# Patient Record
Sex: Female | Born: 1975 | Race: Black or African American | Hispanic: No | Marital: Single | Smoking: Never smoker
Health system: Southern US, Community
[De-identification: ages and names within clinical notes are randomized; demographics above are authoritative.]

## PROBLEM LIST (undated history)

## (undated) DIAGNOSIS — D259 Leiomyoma of uterus, unspecified: Secondary | ICD-10-CM

## (undated) DIAGNOSIS — D649 Anemia, unspecified: Secondary | ICD-10-CM

## (undated) DIAGNOSIS — M25569 Pain in unspecified knee: Secondary | ICD-10-CM

## (undated) DIAGNOSIS — T7840XA Allergy, unspecified, initial encounter: Secondary | ICD-10-CM

## (undated) DIAGNOSIS — R5383 Other fatigue: Secondary | ICD-10-CM

## (undated) DIAGNOSIS — I10 Essential (primary) hypertension: Secondary | ICD-10-CM

## (undated) DIAGNOSIS — E282 Polycystic ovarian syndrome: Secondary | ICD-10-CM

## (undated) DIAGNOSIS — M549 Dorsalgia, unspecified: Secondary | ICD-10-CM

## (undated) DIAGNOSIS — E559 Vitamin D deficiency, unspecified: Secondary | ICD-10-CM

## (undated) DIAGNOSIS — J45909 Unspecified asthma, uncomplicated: Secondary | ICD-10-CM

## (undated) DIAGNOSIS — R7303 Prediabetes: Secondary | ICD-10-CM

## (undated) DIAGNOSIS — R0602 Shortness of breath: Secondary | ICD-10-CM

## (undated) HISTORY — DX: Polycystic ovarian syndrome: E28.2

## (undated) HISTORY — DX: Essential (primary) hypertension: I10

## (undated) HISTORY — DX: Vitamin D deficiency, unspecified: E55.9

## (undated) HISTORY — DX: Unspecified asthma, uncomplicated: J45.909

## (undated) HISTORY — DX: Anemia, unspecified: D64.9

## (undated) HISTORY — DX: Other fatigue: R53.83

## (undated) HISTORY — DX: Dorsalgia, unspecified: M54.9

## (undated) HISTORY — DX: Shortness of breath: R06.02

## (undated) HISTORY — DX: Prediabetes: R73.03

## (undated) HISTORY — DX: Pain in unspecified knee: M25.569

## (undated) HISTORY — DX: Leiomyoma of uterus, unspecified: D25.9

## (undated) HISTORY — DX: Allergy, unspecified, initial encounter: T78.40XA

---
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
## 1999-05-16 ENCOUNTER — Other Ambulatory Visit: Admission: RE | Admit: 1999-05-16 | Discharge: 1999-05-16 | Payer: Self-pay | Admitting: Obstetrics and Gynecology

## 2000-01-04 ENCOUNTER — Encounter: Payer: Self-pay | Admitting: Obstetrics and Gynecology

## 2000-01-04 ENCOUNTER — Ambulatory Visit (HOSPITAL_COMMUNITY): Admission: RE | Admit: 2000-01-04 | Discharge: 2000-01-04 | Payer: Self-pay | Admitting: Obstetrics and Gynecology

## 2001-09-22 ENCOUNTER — Other Ambulatory Visit: Admission: RE | Admit: 2001-09-22 | Discharge: 2001-09-22 | Payer: Self-pay | Admitting: Internal Medicine

## 2003-02-02 ENCOUNTER — Other Ambulatory Visit: Admission: RE | Admit: 2003-02-02 | Discharge: 2003-02-02 | Payer: Self-pay | Admitting: Obstetrics and Gynecology

## 2004-03-15 ENCOUNTER — Ambulatory Visit (HOSPITAL_COMMUNITY): Admission: RE | Admit: 2004-03-15 | Discharge: 2004-03-15 | Payer: Self-pay | Admitting: Obstetrics and Gynecology

## 2004-04-25 ENCOUNTER — Emergency Department (HOSPITAL_COMMUNITY): Admission: EM | Admit: 2004-04-25 | Discharge: 2004-04-25 | Payer: Self-pay | Admitting: Family Medicine

## 2005-11-06 ENCOUNTER — Emergency Department (HOSPITAL_COMMUNITY): Admission: EM | Admit: 2005-11-06 | Discharge: 2005-11-06 | Payer: Self-pay | Admitting: Emergency Medicine

## 2008-05-20 ENCOUNTER — Emergency Department (HOSPITAL_COMMUNITY): Admission: EM | Admit: 2008-05-20 | Discharge: 2008-05-20 | Payer: Self-pay | Admitting: Emergency Medicine

## 2008-08-13 ENCOUNTER — Encounter: Admission: RE | Admit: 2008-08-13 | Discharge: 2008-08-13 | Payer: Self-pay | Admitting: Obstetrics and Gynecology

## 2008-08-17 ENCOUNTER — Encounter: Admission: RE | Admit: 2008-08-17 | Discharge: 2008-08-17 | Payer: Self-pay | Admitting: Obstetrics and Gynecology

## 2011-01-19 ENCOUNTER — Encounter: Payer: Self-pay | Admitting: Obstetrics and Gynecology

## 2011-01-20 ENCOUNTER — Encounter: Payer: Self-pay | Admitting: Obstetrics and Gynecology

## 2011-12-13 ENCOUNTER — Other Ambulatory Visit: Payer: Self-pay | Admitting: Family Medicine

## 2011-12-13 DIAGNOSIS — Z1231 Encounter for screening mammogram for malignant neoplasm of breast: Secondary | ICD-10-CM

## 2012-01-02 ENCOUNTER — Ambulatory Visit
Admission: RE | Admit: 2012-01-02 | Discharge: 2012-01-02 | Disposition: A | Discharge status: Home / Self Care | Payer: BC Managed Care – PPO | Source: Ambulatory Visit | Attending: Family Medicine | Admitting: Family Medicine

## 2012-01-02 DIAGNOSIS — Z1231 Encounter for screening mammogram for malignant neoplasm of breast: Secondary | ICD-10-CM

## 2012-06-04 ENCOUNTER — Other Ambulatory Visit: Payer: Self-pay | Admitting: Endocrinology

## 2012-06-04 DIAGNOSIS — E221 Hyperprolactinemia: Secondary | ICD-10-CM

## 2012-06-17 ENCOUNTER — Other Ambulatory Visit: Payer: BC Managed Care – PPO

## 2012-06-18 ENCOUNTER — Other Ambulatory Visit: Payer: BC Managed Care – PPO

## 2012-06-22 ENCOUNTER — Ambulatory Visit
Admission: RE | Admit: 2012-06-22 | Discharge: 2012-06-22 | Disposition: A | Discharge status: Home / Self Care | Payer: BC Managed Care – PPO | Source: Ambulatory Visit | Attending: Endocrinology | Admitting: Endocrinology

## 2012-06-22 DIAGNOSIS — E221 Hyperprolactinemia: Secondary | ICD-10-CM

## 2012-06-22 MED ORDER — GADOBENATE DIMEGLUMINE 529 MG/ML IV SOLN
15.0000 mL | Freq: Once | INTRAVENOUS | Status: AC | PRN
  Administered 2012-06-22: 15 mL via INTRAVENOUS

## 2013-04-26 ENCOUNTER — Ambulatory Visit (INDEPENDENT_AMBULATORY_CARE_PROVIDER_SITE_OTHER): Payer: BC Managed Care – PPO | Admitting: Internal Medicine

## 2013-04-26 ENCOUNTER — Encounter: Payer: Self-pay | Admitting: Internal Medicine

## 2013-04-26 ENCOUNTER — Telehealth: Payer: Self-pay | Admitting: *Deleted

## 2013-04-26 VITALS — BP 126/84 | HR 87 | Temp 97.6°F | Resp 20 | Ht 62.0 in | Wt 301.0 lb

## 2013-04-26 DIAGNOSIS — E041 Nontoxic single thyroid nodule: Secondary | ICD-10-CM | POA: Insufficient documentation

## 2013-04-26 DIAGNOSIS — Z862 Personal history of diseases of the blood and blood-forming organs and certain disorders involving the immune mechanism: Secondary | ICD-10-CM

## 2013-04-26 DIAGNOSIS — D259 Leiomyoma of uterus, unspecified: Secondary | ICD-10-CM | POA: Insufficient documentation

## 2013-04-26 DIAGNOSIS — I10 Essential (primary) hypertension: Secondary | ICD-10-CM | POA: Insufficient documentation

## 2013-04-26 DIAGNOSIS — N920 Excessive and frequent menstruation with regular cycle: Secondary | ICD-10-CM

## 2013-04-26 DIAGNOSIS — D352 Benign neoplasm of pituitary gland: Secondary | ICD-10-CM | POA: Insufficient documentation

## 2013-04-26 LAB — CBC WITH DIFFERENTIAL/PLATELET
Basophils Absolute: 0.1 10*3/uL (ref 0.0–0.1)
Basophils Relative: 1 % (ref 0–1)
Eosinophils Absolute: 0.4 10*3/uL (ref 0.0–0.7)
Eosinophils Relative: 6 % — ABNORMAL HIGH (ref 0–5)
HCT: 35.5 % — ABNORMAL LOW (ref 36.0–46.0)
Hemoglobin: 11.4 g/dL — ABNORMAL LOW (ref 12.0–15.0)
Lymphocytes Relative: 31 % (ref 12–46)
Lymphs Abs: 2.4 10*3/uL (ref 0.7–4.0)
MCH: 22.9 pg — ABNORMAL LOW (ref 26.0–34.0)
MCHC: 32.1 g/dL (ref 30.0–36.0)
MCV: 71.3 fL — ABNORMAL LOW (ref 78.0–100.0)
Monocytes Absolute: 0.6 10*3/uL (ref 0.1–1.0)
Monocytes Relative: 8 % (ref 3–12)
Neutro Abs: 4.2 10*3/uL (ref 1.7–7.7)
Neutrophils Relative %: 54 % (ref 43–77)
Platelets: 387 10*3/uL (ref 150–400)
RBC: 4.98 MIL/uL (ref 3.87–5.11)
RDW: 14.7 % (ref 11.5–15.5)
WBC: 7.7 10*3/uL (ref 4.0–10.5)

## 2013-04-26 LAB — COMPREHENSIVE METABOLIC PANEL
ALT: 14 U/L (ref 0–35)
AST: 13 U/L (ref 0–37)
Albumin: 3.5 g/dL (ref 3.5–5.2)
Alkaline Phosphatase: 84 U/L (ref 39–117)
BUN: 6 mg/dL (ref 6–23)
CO2: 27 mEq/L (ref 19–32)
Calcium: 9.2 mg/dL (ref 8.4–10.5)
Chloride: 104 mEq/L (ref 96–112)
Creat: 0.62 mg/dL (ref 0.50–1.10)
Glucose, Bld: 78 mg/dL (ref 70–99)
Potassium: 4 mEq/L (ref 3.5–5.3)
Sodium: 137 mEq/L (ref 135–145)
Total Bilirubin: 0.3 mg/dL (ref 0.3–1.2)
Total Protein: 6.6 g/dL (ref 6.0–8.3)

## 2013-04-26 LAB — LIPID PANEL
Cholesterol: 166 mg/dL (ref 0–200)
LDL Cholesterol: 108 mg/dL — ABNORMAL HIGH (ref 0–99)
Total CHOL/HDL Ratio: 4 Ratio
Triglycerides: 83 mg/dL (ref <150)
VLDL: 17 mg/dL (ref 0–40)

## 2013-04-26 LAB — TSH: TSH: 0.37 u[IU]/mL (ref 0.350–4.500)

## 2013-04-26 NOTE — Patient Instructions (Addendum)
See me in j4-6 weeks

## 2013-04-26 NOTE — Progress Notes (Signed)
  Subjective:    Patient ID: April Mann, female    DOB: [birth year], 37 y.o.   MRN: [id number]  HPI  April Mann is a new pt here to establish care.  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    She is concerned over worsening menorrhagia.  LMP 4/15 and states she bleed for 11 days.  She has a history of fibroids.  She reports she has not been back to Dr. Katrinka Blazing since she was started on Dostinex several months ago.  She has not taken any iron as she says it constipates her.  No Known Allergies Past Medical History  Diagnosis Date  . Hypertension   . Fibroid, uterine   . Anemia    History reviewed. No pertinent past surgical history. History   Social History  . Marital Status: Single    Spouse Name: N/A    Number of Children: N/A  . Years of Education: N/A   Occupational History  . Not on file.   Social History Main Topics  . Smoking status: Never Smoker   . Smokeless tobacco: Not on file  . Alcohol Use: No  . Drug Use: No  . Sexually Active: Yes    Birth Control/ Protection: Condom   Other Topics Concern  . Not on file   Social History Narrative  . No narrative on file   Family History  Problem Relation Age of Onset  . Fibromyalgia Mother   . Lupus Maternal Grandmother   . Kidney disease Maternal Grandfather   . Diabetes Maternal Grandfather    Patient Active Problem List   Diagnosis Date Noted  . Prolactinoma 04/26/2013  . Essential hypertension, benign 04/26/2013  . History of anemia 04/26/2013  . Thyroid nodule 04/26/2013  . Fibroid, uterine    No current outpatient prescriptions on file prior to visit.   No current facility-administered medications on file prior to visit.       Review of Systems See HPI    Objective:   Physical Exam  Physical Exam  Nursing note and vitals reviewed.  Constitutional: She is oriented to person, place, and time. She appears  well-developed and well-nourished.  HENT:  Head: Normocephalic and atraumatic.  Cardiovascular: Normal rate and regular rhythm. Exam reveals no gallop and no friction rub.  No murmur heard.  Pulmonary/Chest: Breath sounds normal. She has no wheezes. She has no rales.  Neurological: She is alert and oriented to person, place, and time.  Skin: Skin is warm and dry.  Psychiatric: She has a normal mood and affect. Her behavior is normal.  Ext no edema        Assessment & Plan:  Menorrhagia/ HIstory of fibroids  Will refer for second opinion regarding treatment options  to Dr. Marice Potter  History of anemia  Check today I gave pt Integra samples to be taken once a day  HTN check chemistries  TSH  Continue current meds  Prolactinoma  Counseled pt of the importance of following with her endcrinologist Dr. Katrinka Blazing.  She has only seen him once.  She voices understanding  Morbid obesity.

## 2013-04-26 NOTE — Telephone Encounter (Signed)
Made appointment with Dr Dove at [facility] for May 1 at 1:45. Pt notified while at office. 

## 2013-04-28 ENCOUNTER — Other Ambulatory Visit: Payer: Self-pay | Admitting: Internal Medicine

## 2013-04-28 MED ORDER — INTEGRA F 125-1 MG PO CAPS: 1.0000 | ORAL_CAPSULE | Freq: Every day | ORAL | Status: DC

## 2013-04-28 NOTE — Progress Notes (Signed)
Pharmacy information

## 2013-04-29 ENCOUNTER — Other Ambulatory Visit: Payer: Self-pay | Admitting: *Deleted

## 2013-04-29 ENCOUNTER — Encounter: Payer: Self-pay | Admitting: *Deleted

## 2013-04-29 ENCOUNTER — Telehealth: Payer: Self-pay | Admitting: *Deleted

## 2013-04-29 ENCOUNTER — Encounter: Payer: PRIVATE HEALTH INSURANCE | Admitting: Obstetrics & Gynecology

## 2013-04-29 MED ORDER — INTEGRA F 125-1 MG PO CAPS: 1.0000 | ORAL_CAPSULE | Freq: Every day | ORAL | Status: DC

## 2013-04-29 NOTE — Telephone Encounter (Signed)
LVM message regarding her results and medication also mailed results to her home address

## 2013-04-29 NOTE — Telephone Encounter (Signed)
Message copied by Mathews Robinsons on Thu Apr 29, 2013 10:19 AM ------      Message from: Raechel Chute D      Created: Wed Apr 28, 2013 11:47 AM       [name]             Call pt and let her know that she is mildly anemic and be sure to keep appt with Dr. Marice Potter and to take her Integra samples I gave her.  I will also order Ingtegra to her pharmacy for her to pick up. Take one a day ------

## 2013-05-18 ENCOUNTER — Encounter: Payer: PRIVATE HEALTH INSURANCE | Admitting: Obstetrics & Gynecology

## 2013-05-20 ENCOUNTER — Telehealth: Payer: Self-pay | Admitting: Internal Medicine

## 2013-05-20 NOTE — Telephone Encounter (Signed)
April Mann   I note pt did not keep appt with Dr. Marice Potter that was scheduled for 04/29/2013 .   Call pt and advise her to reschedule this appt .  At her earliest convenience and counsel it is important to do so as she is anemic

## 2013-05-20 NOTE — Telephone Encounter (Signed)
LVM message with pt to return call advised pt to reschedule and left number for Dr Ellin Saba office

## 2013-06-28 ENCOUNTER — Telehealth: Payer: Self-pay | Admitting: *Deleted

## 2013-06-28 NOTE — Telephone Encounter (Signed)
Needs refill of Lisinopril.  Has used last pill. Uses [facility] [address]; their phone # is [phone]

## 2013-06-29 ENCOUNTER — Other Ambulatory Visit: Payer: Self-pay | Admitting: *Deleted

## 2013-06-29 MED ORDER — LISINOPRIL-HYDROCHLOROTHIAZIDE 10-12.5 MG PO TABS: 1.0000 | ORAL_TABLET | Freq: Every day | ORAL | Status: DC

## 2013-06-29 NOTE — Telephone Encounter (Signed)
Refill request

## 2013-09-07 ENCOUNTER — Other Ambulatory Visit: Payer: Self-pay | Admitting: *Deleted

## 2013-09-07 MED ORDER — LISINOPRIL-HYDROCHLOROTHIAZIDE 10-12.5 MG PO TABS: 1.0000 | ORAL_TABLET | Freq: Every day | ORAL | Status: DC

## 2014-01-05 ENCOUNTER — Other Ambulatory Visit: Payer: Self-pay | Admitting: *Deleted

## 2014-01-05 ENCOUNTER — Telehealth: Payer: Self-pay | Admitting: *Deleted

## 2014-01-05 MED ORDER — LISINOPRIL-HYDROCHLOROTHIAZIDE 10-12.5 MG PO TABS: 1.0000 | ORAL_TABLET | Freq: Every day | ORAL | Status: DC

## 2014-01-05 NOTE — Telephone Encounter (Signed)
April Mann  Call pt and give her a 30 min appt.  I have not seen her since last April    Will refill for 2 months

## 2014-01-05 NOTE — Telephone Encounter (Signed)
Orlene needs her  lisinopril-hydrochlorothiazide (PRINZIDE,ZESTORETIC) 10-12.5 MG per tablet [[id number]] refilled  She only has 5-6 left.  She would like a 90 day supply. [facility].

## 2014-01-06 NOTE — Telephone Encounter (Signed)
Pt has an appt in March

## 2014-03-24 ENCOUNTER — Encounter: Payer: Self-pay | Admitting: Internal Medicine

## 2014-03-24 ENCOUNTER — Ambulatory Visit (INDEPENDENT_AMBULATORY_CARE_PROVIDER_SITE_OTHER): Payer: PRIVATE HEALTH INSURANCE | Admitting: Internal Medicine

## 2014-03-24 ENCOUNTER — Other Ambulatory Visit: Payer: Self-pay | Admitting: Internal Medicine

## 2014-03-24 VITALS — BP 139/92 | HR 76 | Temp 98.5°F | Resp 18 | Wt 299.0 lb

## 2014-03-24 DIAGNOSIS — D353 Benign neoplasm of craniopharyngeal duct: Secondary | ICD-10-CM

## 2014-03-24 DIAGNOSIS — D352 Benign neoplasm of pituitary gland: Secondary | ICD-10-CM

## 2014-03-24 DIAGNOSIS — I10 Essential (primary) hypertension: Secondary | ICD-10-CM

## 2014-03-24 DIAGNOSIS — E041 Nontoxic single thyroid nodule: Secondary | ICD-10-CM

## 2014-03-24 DIAGNOSIS — Z862 Personal history of diseases of the blood and blood-forming organs and certain disorders involving the immune mechanism: Secondary | ICD-10-CM

## 2014-03-24 DIAGNOSIS — Z139 Encounter for screening, unspecified: Secondary | ICD-10-CM

## 2014-03-24 DIAGNOSIS — Z0000 Encounter for general adult medical examination without abnormal findings: Secondary | ICD-10-CM

## 2014-03-24 LAB — LIPID PANEL
CHOL/HDL RATIO: 4.4 ratio
Cholesterol: 168 mg/dL (ref 0–200)
HDL: 38 mg/dL — AB (ref >39)
LDL CALC: 118 mg/dL — AB (ref 0–99)
Triglycerides: 59 mg/dL (ref <150)
VLDL: 12 mg/dL (ref 0–40)

## 2014-03-24 LAB — CBC WITH DIFFERENTIAL/PLATELET
BASOS ABS: 0.1 10*3/uL (ref 0.0–0.1)
BASOS PCT: 1 % (ref 0–1)
EOS PCT: 3 % (ref 0–5)
Eosinophils Absolute: 0.3 10*3/uL (ref 0.0–0.7)
HCT: 36.8 % (ref 36.0–46.0)
Hemoglobin: 12.2 g/dL (ref 12.0–15.0)
LYMPHS PCT: 33 % (ref 12–46)
Lymphs Abs: 2.8 10*3/uL (ref 0.7–4.0)
MCH: 22.6 pg — ABNORMAL LOW (ref 26.0–34.0)
MCHC: 33.2 g/dL (ref 30.0–36.0)
MCV: 68.1 fL — ABNORMAL LOW (ref 78.0–100.0)
Monocytes Absolute: 0.7 10*3/uL (ref 0.1–1.0)
Monocytes Relative: 8 % (ref 3–12)
NEUTROS ABS: 4.7 10*3/uL (ref 1.7–7.7)
Neutrophils Relative %: 55 % (ref 43–77)
PLATELETS: 407 10*3/uL — AB (ref 150–400)
RBC: 5.4 MIL/uL — ABNORMAL HIGH (ref 3.87–5.11)
RDW: 15.1 % (ref 11.5–15.5)
WBC: 8.5 10*3/uL (ref 4.0–10.5)

## 2014-03-24 LAB — COMPREHENSIVE METABOLIC PANEL
ALK PHOS: 95 U/L (ref 39–117)
ALT: 18 U/L (ref 0–35)
AST: 15 U/L (ref 0–37)
Albumin: 3.7 g/dL (ref 3.5–5.2)
BUN: 8 mg/dL (ref 6–23)
CALCIUM: 9.5 mg/dL (ref 8.4–10.5)
CHLORIDE: 104 meq/L (ref 96–112)
CO2: 25 mEq/L (ref 19–32)
CREATININE: 0.6 mg/dL (ref 0.50–1.10)
Glucose, Bld: 82 mg/dL (ref 70–99)
POTASSIUM: 4.3 meq/L (ref 3.5–5.3)
Sodium: 138 mEq/L (ref 135–145)
Total Bilirubin: 0.3 mg/dL (ref 0.2–1.2)
Total Protein: 6.7 g/dL (ref 6.0–8.3)

## 2014-03-24 LAB — TSH: TSH: 0.277 u[IU]/mL — AB (ref 0.350–4.500)

## 2014-03-25 LAB — VITAMIN D 25 HYDROXY (VIT D DEFICIENCY, FRACTURES): VIT D 25 HYDROXY: 46 ng/mL (ref 30–89)

## 2014-03-26 NOTE — Patient Instructions (Signed)
See me in 6 months or prn

## 2014-03-26 NOTE — Progress Notes (Addendum)
Subjective:    Patient ID: April Mann, female    DOB: 09-03-1976, 38 y.o.   MRN: [id number]  HPI  April Mann is here for CPE  Anemia  She has been taking her Integra without constipation - tolerating well.  Anemia felt due to heavy menses.  She has been followed by GYN at [facility] and controlling menses with Sprintec.  She tells me however that this caused swelling and she stopped Sprintec about one week ago.   She has follow up with GYN later this year and will get her pap at that time  HTN:  Well controlled but she is asking if she can come off med  Prolactionoma  Managed by endocrinologist Dr. Tamala Mann  Pt also reports that Dr. Tamala Mann told her she had a small thyroid nodule and that he is following this  No Known Allergies Past Medical History  Diagnosis Date  . Hypertension   . Fibroid, uterine   . Anemia    History reviewed. No pertinent past surgical history. History   Social History  . Marital Status: Single    Spouse Name: N/A    Number of Children: N/A  . Years of Education: N/A   Occupational History  . Not on file.   Social History Main Topics  . Smoking status: Never Smoker   . Smokeless tobacco: Not on file  . Alcohol Use: No  . Drug Use: No  . Sexual Activity: Yes    Birth Control/ Protection: Condom   Other Topics Concern  . Not on file   Social History Narrative  . No narrative on file   Family History  Problem Relation Age of Onset  . Fibromyalgia Mother   . Lupus Maternal Grandmother   . Kidney disease Maternal Grandfather   . Diabetes Maternal Grandfather    Patient Active Problem List   Diagnosis Date Noted  . Prolactinoma 04/26/2013  . Essential hypertension, benign 04/26/2013  . History of anemia 04/26/2013  . Thyroid nodule 04/26/2013  . Fibroid, uterine    Current Outpatient Prescriptions on File Prior to Visit  Medication Sig Dispense Refill  . Fe Fum-FePoly-FA-Vit C-Vit B3 (INTEGRA F) 125-1 MG CAPS Take 1 capsule by  mouth daily.  30 capsule  3  . lisinopril-hydrochlorothiazide (PRINZIDE,ZESTORETIC) 10-12.5 MG per tablet Take 1 tablet by mouth daily.  60 tablet  0  . MULTIPLE MINERALS-VITAMINS PO Take 1 tablet by mouth daily.       No current facility-administered medications on file prior to visit.      Review of Systems  Respiratory: Negative for cough, chest tightness and shortness of breath.   Cardiovascular: Negative for chest pain, palpitations and leg swelling.  Gastrointestinal: Negative for abdominal pain.  All other systems reviewed and are negative.       Objective:   Physical Exam Physical Exam  Nursing note and vitals reviewed.  Constitutional: She is oriented to person, place, and time. She appears well-developed and well-nourished.  HENT:  Head: Normocephalic and atraumatic.  Right Ear: Tympanic membrane and ear canal normal. No drainage. Tympanic membrane is not injected and not erythematous.  Left Ear: Tympanic membrane and ear canal normal. No drainage. Tympanic membrane is not injected and not erythematous.  Nose: Nose normal. Right sinus exhibits no maxillary sinus tenderness and no frontal sinus tenderness. Left sinus exhibits no maxillary sinus tenderness and no frontal sinus tenderness.  Mouth/Throat: Oropharynx is clear and moist. No oral lesions. No oropharyngeal exudate.  Eyes: Conjunctivae  and EOM are normal. Pupils are equal, round, and reactive to light.  Neck: Normal range of motion. Neck supple. No JVD present. Carotid bruit is not present. No mass and no thyromegaly present.  Cardiovascular: Normal rate, regular rhythm, S1 normal, S2 normal and intact distal pulses. Exam reveals no gallop and no friction rub.  No murmur heard.  Pulses:  Carotid pulses are 2+ on the right side, and 2+ on the left side.  Dorsalis pedis pulses are 2+ on the right side, and 2+ on the left side.  No carotid bruit. No LE edema  Pulmonary/Chest: Breath sounds normal. She has no  wheezes. She has no rales. She exhibits no tenderness.  Breast no discrete masses no nipple dischagre no axillary adenopathy bilaterally Abdominal: Soft. Bowel sounds are normal. She exhibits no distension and no mass. There is no hepatosplenomegaly. There is no tenderness. There is no CVA tenderness.  Musculoskeletal: Normal range of motion.  No active synovitis to joints.  Lymphadenopathy:  She has no cervical adenopathy.  She has no axillary adenopathy.  Right: No inguinal and no supraclavicular adenopathy present.  Left: No inguinal and no supraclavicular adenopathy present.  Neurological: She is alert and oriented to person, place, and time. She has normal strength and normal reflexes. She displays no tremor. No cranial nerve deficit or sensory deficit. Coordination and gait normal.  Skin: Skin is warm and dry. No rash noted. No cyanosis. Nails show no clubbing.  Psychiatric: She has a normal mood and affect. Her speech is normal and behavior is normal. Cognition and memory are normal.           Assessment & Plan:  HM:  She declines TDap today    Non smoker  See scanned HM sheet  HTN:  Explained to pt that if she came off now her BP would elevate too high.  Encouraged weight loss and if she can lose 15# would consider lower dose.  She voices understanding  Check chemistries today  Anemia  Continue  Integra  Check all labs today  Uterine fibroid./menorrhagia will follow with [facility].  Advised if bleeding starts to be heavy to contact GYN  She voices understanding  Prolactinoma/Thyroid nodule  I do not feel a nodule on exam today. Managed by Dr. Tamala Mann  Morbid obesity.  Increase exercise ,  DASH diet given.  See me in 6 months or prn  Addendum  3/31    Spoke with pt and informed of thyroid lab results.  TSH slightly over-supressed but normal free levels.  She had seen Dr. Tamala Mann of [facility] in the past but wishes to see a new endocrinologist.  Will set up with [facility]  endocrinology

## 2014-03-28 ENCOUNTER — Telehealth: Payer: Self-pay | Admitting: *Deleted

## 2014-03-28 LAB — T4, FREE: Free T4: 1.09 ng/dL (ref 0.80–1.80)

## 2014-03-28 LAB — T3, FREE: T3, Free: 3.2 pg/mL (ref 2.3–4.2)

## 2014-03-28 NOTE — Telephone Encounter (Signed)
Free T3  And T4 added to 03/24/14 labs

## 2014-03-29 ENCOUNTER — Telehealth: Payer: Self-pay | Admitting: Internal Medicine

## 2014-03-29 NOTE — Telephone Encounter (Signed)
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

## 2014-03-29 NOTE — Addendum Note (Signed)
Addended by: Emi Belfast D on: 03/29/2014 08:18 AM   Modules accepted: Orders

## 2014-04-25 ENCOUNTER — Other Ambulatory Visit: Payer: Self-pay | Admitting: Internal Medicine

## 2014-04-25 NOTE — Telephone Encounter (Signed)
Refill request

## 2014-04-27 ENCOUNTER — Telehealth: Payer: Self-pay | Admitting: Internal Medicine

## 2014-04-27 MED ORDER — TRIAMTERENE-HCTZ 37.5-25 MG PO TABS: 1.0000 | ORAL_TABLET | Freq: Every day | ORAL | Status: DC

## 2014-04-27 NOTE — Telephone Encounter (Signed)
Spoke with pt regarding refill of BP pill  She tells me she does not take it every day  Initially placed on Lisinopril/HCTZ by former primary  ADvised if she is planning preganancy lisinopril  Associated with birth defects  Will stop lisinopril and place on HCTZ only.  Pt advised to take HCTZ daily  She is to see me in 4-6 weeks  For BP eval   Pt will make appt

## 2014-04-27 NOTE — Telephone Encounter (Signed)
Left message on cell and home phone to call office regarding refill of her BP med

## 2014-05-25 ENCOUNTER — Ambulatory Visit: Payer: PRIVATE HEALTH INSURANCE | Admitting: Internal Medicine

## 2014-07-18 ENCOUNTER — Ambulatory Visit: Payer: PRIVATE HEALTH INSURANCE | Admitting: Internal Medicine

## 2014-07-28 ENCOUNTER — Other Ambulatory Visit: Payer: Self-pay | Admitting: Internal Medicine

## 2014-07-28 NOTE — Telephone Encounter (Signed)
Laurana, [employer]ro - 07/28/2014 5:25 AM ','<More Detail >>       Rx [employer] Interface        Sent: Thu July 28, 2014 5:25 AM    To: [facility] Clinical Pool                   Message     ----- Message from [facility] sent at 07/28/2014 5:25 AM -----         The demographic information from the pharmacy is:    Patient Name: April Mann, April Mann    Patient DOB: 22-Feb-1976    Patient Gender: Female    Address:    [place]             Epsie Walthall MRN: [id number] Home: [phone]  38 y.o. / Female (May 06, 1976) PCP: Lanice Shirts, MD Work: Not available  Pharmacy: [facility] [place], [address] [place] [address] Ph: [phone] Wt: 299 lb (135.626 kg) (03/24/2014) Mobile: [phone]          Guarantor Account: Nikie, Cid ([id number])     Relation to Patient: Account Type Service Area    Self Personal/Family [place] for This Account     Coverage ID Payor Plan Insurance ID     [id number] [employer] [id number]             Guarantor Account: Calvin, Chura ([id number])     Relation to Patient: Account Type Service Area    Self Personal/Family [facility]                Guarantor Account: Jaeleah, Smyser ([id number])     Relation to Patient: Account Type Service Area    Self Personal/Family [facility]                   Requested Medications     Medication name:  Name from pharmacy:  triamterene-hydrochlorothiazide ([id number]) 37.5-25 MG per tablet  TRIAMT/HCTZ 37.5-25MG  TAB    Sig: TAKE ONE TABLET BY MOUTH ONCE DAILY    Dispense: 60 tablet Refills: 0 Start: 07/28/2014  Class: Normal    Requested on: 04/27/2014    Originally ordered on: 04/27/2014 Last refill: 04/28/2014 Order History and Details              Call  Documentation     No notes of this type exist for this encounter.             Contacts       Type Contact Phone    07/28/2014 5:25 AM Interface (Incoming) [facility] [phone]              Allergies as of 07/28/2014 Review Complete On: 03/24/2014 By: Marcial Pacas, RN     No Known Allergies             Patient Flags     No FYI flags for this patient.                 Outstanding Procedures      Normal Orders       Priority Ordered    Ambulatory referral to Endocrinology Routine 03/29/2014    POCT urinalysis  dipstick Routine 03/24/2014    Ambulatory referral to Gynecology Routine 04/26/2013              Past Appointments     Date Time Status Provider Dept Type Appt Notes    07/18/2014 8:15 AM Can Philemon Kingdom, MD [facility] [phone] NEW PATIENT THYROID NODULE NOTES IN EPIC [facility]    05/25/2014 4:15 PM Can Lanice Shirts, MD [facility] ESTPT30 4 week follow up/ hb    03/24/2014 1:00 PM Comp Lanice Shirts, MD [facility] CPE45 CPE/ hb    05/18/2013 1:45 PM Can Emily Filbert, MD [facility] menorrhagia menorrhagia    04/29/2013 2:15 PM Can Emily Filbert, MD [facility] NEWGYN menorrhagia menorrhagia    04/26/2013 10:45 AM Comp Lanice Shirts, MD [facility] NEWPT30 establish care appt/ med refill/KH    06/22/2012 10:00 AM Comp Gi-315 Mr 1 GI-315MRI XMR/MRA BRAI PT WILL ARRIVE AT 930AM TO DRAW LABS/WT-285/CLAUS/MEDS/DRIVER bc [id number]    06/18/2012 6:45 PM Can Gi-315 Mr 1 GI-315MRI XMR/MRA BRAI PT WILL ARRIVE AT 830AM TO [facility]RAW LABS/WT-285/CLAUS/MEDS/DRIVER    2/39/5320 2:33 AM Can Gi-315 Mr 1 GI-315MRI XMR/MRA BRAI PT WILL ARRIVE AT 830AM TO DRAW LABS/WT-285/CLAUS/MEDS/DRIVER    04/01/5685 1:68 AM Comp Gi-Bcg Mm 2 GI-BCGMM MMDIGITALS PF; NONE CS/PT [place] [place] [facility] [address] [place] [address]    Phone:  [phone] Fax: [phone]    Open 24 Hours?: No

## 2014-07-28 NOTE — Telephone Encounter (Signed)
Requested Medications     Medication name:  Name from pharmacy:  triamterene-hydrochlorothiazide ([id number]-88) 37.5-25 MG per tablet  TRIAMT/HCTZ 37.5-25MG  TAB    Sig: TAKE ONE TABLET BY MOUTH ONCE DAILY    Dispense: 60 tablet Refills: 0 Start: 07/28/2014  Class: Normal    Requested on: 04/27/2014    Originally ordered on: 04/27/2014 Last refill: 04/28/2014 Order History and Details

## 2014-10-31 ENCOUNTER — Encounter: Payer: Self-pay | Admitting: Internal Medicine

## 2015-04-05 ENCOUNTER — Encounter: Payer: Self-pay | Admitting: Internal Medicine

## 2015-04-05 ENCOUNTER — Ambulatory Visit (INDEPENDENT_AMBULATORY_CARE_PROVIDER_SITE_OTHER): Payer: BLUE CROSS/BLUE SHIELD | Admitting: Internal Medicine

## 2015-04-05 VITALS — BP 117/69 | HR 79 | Resp 16 | Ht 62.0 in | Wt 297.0 lb

## 2015-04-05 DIAGNOSIS — Z0000 Encounter for general adult medical examination without abnormal findings: Secondary | ICD-10-CM | POA: Diagnosis not present

## 2015-04-05 DIAGNOSIS — R7989 Other specified abnormal findings of blood chemistry: Secondary | ICD-10-CM | POA: Diagnosis not present

## 2015-04-05 DIAGNOSIS — D352 Benign neoplasm of pituitary gland: Secondary | ICD-10-CM

## 2015-04-05 DIAGNOSIS — E041 Nontoxic single thyroid nodule: Secondary | ICD-10-CM | POA: Diagnosis not present

## 2015-04-05 LAB — COMPLETE METABOLIC PANEL WITH GFR
ALBUMIN: 4 g/dL (ref 3.5–5.2)
ALT: 18 U/L (ref 0–35)
AST: 14 U/L (ref 0–37)
Alkaline Phosphatase: 91 U/L (ref 39–117)
BUN: 10 mg/dL (ref 6–23)
CALCIUM: 9.8 mg/dL (ref 8.4–10.5)
CHLORIDE: 101 meq/L (ref 96–112)
CO2: 24 mEq/L (ref 19–32)
Creat: 0.58 mg/dL (ref 0.50–1.10)
GFR, Est African American: >89 mL/min
GLUCOSE: 91 mg/dL (ref 70–99)
POTASSIUM: 3.7 meq/L (ref 3.5–5.3)
Sodium: 137 mEq/L (ref 135–145)
Total Bilirubin: 0.2 mg/dL (ref 0.2–1.2)
Total Protein: 7.4 g/dL (ref 6.0–8.3)

## 2015-04-05 LAB — POCT URINALYSIS DIPSTICK
BILIRUBIN UA: NEGATIVE
Blood, UA: NEGATIVE
GLUCOSE UA: NEGATIVE
KETONES UA: NEGATIVE
Leukocytes, UA: NEGATIVE
Nitrite, UA: NEGATIVE
PROTEIN UA: NEGATIVE
SPEC GRAV UA: 1.01
UROBILINOGEN UA: NEGATIVE
pH, UA: 6.5

## 2015-04-05 LAB — T4, FREE: Free T4: 1.12 ng/dL (ref 0.80–1.80)

## 2015-04-05 LAB — LIPID PANEL
CHOL/HDL RATIO: 4 ratio
Cholesterol: 168 mg/dL (ref 0–200)
HDL: 42 mg/dL — ABNORMAL LOW (ref >=46)
LDL Cholesterol: 110 mg/dL — ABNORMAL HIGH (ref 0–99)
Triglycerides: 81 mg/dL (ref <150)
VLDL: 16 mg/dL (ref 0–40)

## 2015-04-05 LAB — T3, FREE: T3, Free: 3.2 pg/mL (ref 2.3–4.2)

## 2015-04-05 MED ORDER — INTEGRA F 125-1 MG PO CAPS: ORAL_CAPSULE | ORAL | Status: DC

## 2015-04-05 MED ORDER — TRIAMTERENE-HCTZ 37.5-25 MG PO TABS: 1.0000 | ORAL_TABLET | Freq: Every day | ORAL | Status: DC

## 2015-04-05 NOTE — Progress Notes (Signed)
Subjective:    Patient ID: April Mann, female    DOB: [birth year], 39 y.o.   MRN: [id number]  HPI  03/24/2014 CPE note Assessment & Plan:  HM: She declines TDap today Non smoker See scanned HM sheet  HTN: Explained to pt that if she came off now her BP would elevate too high. Encouraged weight loss and if she can lose 15# would consider lower dose. She voices understanding Check chemistries today  Anemia Continue Integra Check all labs today  Uterine fibroid./menorrhagia will follow with [facility]. Advised if bleeding starts to be heavy to contact GYN She voices understanding  Prolactinoma/Thyroid nodule I do not feel a nodule on exam today. Managed by Dr. Tamala Mann  Morbid obesity. Increase exercise , DASH diet given.  See me in 6 months or prn  Addendum 3/31   Spoke with pt and informed of thyroid lab results. TSH slightly over-supressed but normal free levels. She had seen Dr. Tamala Mann of [facility] in the past but wishes to see a new endocrinologist. Will set up with [facility] endocrinology        TODAY  April Mann is here for CPE  HM:  Pap per [facility]  She is a non-smoker   Thyroid nodule/  PCOS    She was a pt of Dr. Tamala Mann at [facility],  She requested a new endocrinologist and we referred her to Dr. Cruzita Mann  .  Pt cancelled the appt last year. Upon review of her chart I note that I had referred her to both a GYN MD for menorrhagia  - she tells me she did see Dr. Micah Mann who evalauted her in 2014  - we called for records but unable to obtain    Menorrhagia  States she is bleeding less .  She has run out of [employer].  HTN tolerating meds well   No Known Allergies Past Medical History  Diagnosis Date  . Hypertension   . Fibroid, uterine   . Anemia    No past surgical history on file. History   Social History  . Marital Status: Single    Spouse Name: N/A  . Number of Children: N/A  . Years of Education: N/A   Occupational  History  . Not on file.   Social History Main Topics  . Smoking status: Never Smoker   . Smokeless tobacco: Not on file  . Alcohol Use: No  . Drug Use: No  . Sexual Activity: Yes    Birth Control/ Protection: Condom   Other Topics Concern  . Not on file   Social History Narrative   Family History  Problem Relation Age of Onset  . Fibromyalgia Mother   . Lupus Maternal Grandmother   . Kidney disease Maternal Grandfather   . Diabetes Maternal Grandfather    Patient Active Problem List   Diagnosis Date Noted  . Prolactinoma 04/26/2013  . Essential hypertension, benign 04/26/2013  . History of anemia 04/26/2013  . Thyroid nodule 04/26/2013  . Fibroid, uterine    Current Outpatient Prescriptions on File Prior to Visit  Medication Sig Dispense Refill  . Fe Fum-FePoly-FA-Vit C-Vit B3 (INTEGRA F) 125-1 MG CAPS Take 1 capsule by mouth daily. 30 capsule 3  . MULTIPLE MINERALS-VITAMINS PO Take 1 tablet by mouth daily.    Marland Kitchen triamterene-hydrochlorothiazide (MAXZIDE-25) 37.5-25 MG per tablet TAKE ONE TABLET BY MOUTH ONCE DAILY 90 tablet 1   No current facility-administered medications on file prior to visit.      Review  of Systems  Respiratory: Negative for cough, chest tightness, shortness of breath and wheezing.   Cardiovascular: Negative for chest pain, palpitations and leg swelling.       Objective:   Physical Exam Physical Exam  Nursing note and vitals reviewed.  Constitutional: She is oriented to person, place, and time. She appears well-developed and well-nourished.  HENT:  Head: Normocephalic and atraumatic.  Right Ear: Tympanic membrane and ear canal normal. No drainage. Tympanic membrane is not injected and not erythematous.  Left Ear: Tympanic membrane and ear canal normal. No drainage. Tympanic membrane is not injected and not erythematous.  Nose: Nose normal. Right sinus exhibits no maxillary sinus tenderness and no frontal sinus tenderness. Left sinus exhibits  no maxillary sinus tenderness and no frontal sinus tenderness.  Mouth/Throat: Oropharynx is clear and moist. No oral lesions. No oropharyngeal exudate.  Eyes: Conjunctivae and EOM are normal. Pupils are equal, round, and reactive to light.  Neck: Normal range of motion. Neck supple. No JVD present. Carotid bruit is not present. No mass and no thyromegaly present.  Cardiovascular: Normal rate, regular rhythm, S1 normal, S2 normal and intact distal pulses. Exam reveals no gallop and no friction rub.  No murmur heard.  Pulses:  Carotid pulses are 2+ on the right side, and 2+ on the left side.  Dorsalis pedis pulses are 2+ on the right side, and 2+ on the left side.  No carotid bruit. No LE edema  Pulmonary/Chest: Breath sounds normal. She has no wheezes. She has no rales. She exhibits no tenderness.   Breast no discrete mass no nipple discharge no axillary adenopathy bilaterally Abdominal: Soft. Bowel sounds are normal. She exhibits no distension and no mass. There is no hepatosplenomegaly. There is no tenderness. There is no CVA tenderness.  Musculoskeletal: Normal range of motion.  No active synovitis to joints.  Lymphadenopathy:  She has no cervical adenopathy.  She has no axillary adenopathy.  Right: No inguinal and no supraclavicular adenopathy present.  Left: No inguinal and no supraclavicular adenopathy present.  Neurological: She is alert and oriented to person, place, and time. She has normal strength and normal reflexes. She displays no tremor. No cranial nerve deficit or sensory deficit. Coordination and gait normal.  Skin: Skin is warm and dry. No rash noted. No cyanosis. Nails show no clubbing.  Psychiatric: She has a normal mood and affect. Her speech is normal and behavior is normal. Cognition and memory are normal.          Assessment & Plan:  HM  See scanned sheet  She is a non-smoker   HTN  Continue maxzide  Morbid obesity  Advised wt loss.  Reduce carbs and increase  exercise  Prolactinoma/ thyroid nodule  Managed by endocrine  ADvised to keep appt with Dr. Tamala Mann at [facility] who she has seen in past .  She voices understandig   Will check labs today

## 2015-04-06 LAB — CBC WITH DIFFERENTIAL/PLATELET
BASOS ABS: 0.1 10*3/uL (ref 0.0–0.1)
BASOS PCT: 1 % (ref 0–1)
EOS ABS: 0.2 10*3/uL (ref 0.0–0.7)
EOS PCT: 3 % (ref 0–5)
HCT: 40.8 % (ref 36.0–46.0)
HEMOGLOBIN: 13 g/dL (ref 12.0–15.0)
LYMPHS ABS: 3.2 10*3/uL (ref 0.7–4.0)
LYMPHS PCT: 39 % (ref 12–46)
MCH: 23.4 pg — AB (ref 26.0–34.0)
MCHC: 31.9 g/dL (ref 30.0–36.0)
MCV: 73.4 fL — AB (ref 78.0–100.0)
MONO ABS: 0.7 10*3/uL (ref 0.1–1.0)
MONOS PCT: 8 % (ref 3–12)
MPV: 10.7 fL (ref 8.6–12.4)
Neutro Abs: 4 10*3/uL (ref 1.7–7.7)
Neutrophils Relative %: 49 % (ref 43–77)
Platelets: 373 10*3/uL (ref 150–400)
RBC: 5.56 MIL/uL — AB (ref 3.87–5.11)
RDW: 14.9 % (ref 11.5–15.5)
WBC: 8.2 10*3/uL (ref 4.0–10.5)

## 2015-04-06 LAB — TSH: TSH: 0.509 u[IU]/mL (ref 0.350–4.500)

## 2015-04-06 LAB — VITAMIN D 25 HYDROXY (VIT D DEFICIENCY, FRACTURES): VIT D 25 HYDROXY: 33 ng/mL (ref 30–100)

## 2015-04-07 ENCOUNTER — Encounter: Payer: Self-pay | Admitting: *Deleted

## 2015-04-11 ENCOUNTER — Encounter: Payer: Self-pay | Admitting: *Deleted

## 2015-05-15 ENCOUNTER — Other Ambulatory Visit: Payer: Self-pay | Admitting: Internal Medicine

## 2015-05-15 MED ORDER — TRIAMTERENE-HCTZ 37.5-25 MG PO TABS: 1.0000 | ORAL_TABLET | Freq: Every day | ORAL | Status: DC

## 2015-11-28 ENCOUNTER — Other Ambulatory Visit: Payer: Self-pay

## 2015-11-28 DIAGNOSIS — Z1231 Encounter for screening mammogram for malignant neoplasm of breast: Secondary | ICD-10-CM

## 2015-12-29 ENCOUNTER — Ambulatory Visit
Admission: RE | Admit: 2015-12-29 | Discharge: 2015-12-29 | Disposition: A | Discharge status: Home / Self Care | Payer: BLUE CROSS/BLUE SHIELD | Source: Ambulatory Visit

## 2015-12-29 DIAGNOSIS — Z1231 Encounter for screening mammogram for malignant neoplasm of breast: Secondary | ICD-10-CM

## 2016-11-25 ENCOUNTER — Other Ambulatory Visit: Payer: Self-pay | Admitting: Obstetrics and Gynecology

## 2016-11-25 DIAGNOSIS — Z1231 Encounter for screening mammogram for malignant neoplasm of breast: Secondary | ICD-10-CM

## 2016-12-31 ENCOUNTER — Ambulatory Visit: Payer: BLUE CROSS/BLUE SHIELD

## 2016-12-31 ENCOUNTER — Ambulatory Visit
Admission: RE | Admit: 2016-12-31 | Discharge: 2016-12-31 | Disposition: A | Discharge status: Home / Self Care | Payer: BLUE CROSS/BLUE SHIELD | Source: Ambulatory Visit | Attending: Obstetrics and Gynecology | Admitting: Obstetrics and Gynecology

## 2016-12-31 DIAGNOSIS — Z1231 Encounter for screening mammogram for malignant neoplasm of breast: Secondary | ICD-10-CM

## 2017-04-02 ENCOUNTER — Ambulatory Visit: Payer: BLUE CROSS/BLUE SHIELD | Admitting: Nurse Practitioner

## 2017-08-19 ENCOUNTER — Other Ambulatory Visit: Payer: Self-pay | Admitting: Internal Medicine

## 2017-08-19 DIAGNOSIS — E221 Hyperprolactinemia: Secondary | ICD-10-CM

## 2017-09-02 ENCOUNTER — Other Ambulatory Visit: Payer: BLUE CROSS/BLUE SHIELD

## 2017-10-30 ENCOUNTER — Ambulatory Visit (INDEPENDENT_AMBULATORY_CARE_PROVIDER_SITE_OTHER): Payer: BLUE CROSS/BLUE SHIELD

## 2017-10-30 ENCOUNTER — Ambulatory Visit (INDEPENDENT_AMBULATORY_CARE_PROVIDER_SITE_OTHER): Payer: BLUE CROSS/BLUE SHIELD | Admitting: Podiatry

## 2017-10-30 VITALS — BP 129/81 | HR 86 | Ht 62.0 in | Wt 295.0 lb

## 2017-10-30 DIAGNOSIS — M76822 Posterior tibial tendinitis, left leg: Secondary | ICD-10-CM | POA: Diagnosis not present

## 2017-10-30 DIAGNOSIS — M25571 Pain in right ankle and joints of right foot: Secondary | ICD-10-CM | POA: Diagnosis not present

## 2017-10-30 DIAGNOSIS — M25572 Pain in left ankle and joints of left foot: Secondary | ICD-10-CM

## 2017-10-30 DIAGNOSIS — M779 Enthesopathy, unspecified: Secondary | ICD-10-CM

## 2017-10-30 DIAGNOSIS — M2142 Flat foot [pes planus] (acquired), left foot: Secondary | ICD-10-CM

## 2017-10-30 MED ORDER — MELOXICAM 15 MG PO TABS: 15.0000 mg | ORAL_TABLET | Freq: Every day | ORAL | 0 refills | Status: DC

## 2017-10-30 NOTE — Progress Notes (Signed)
   Subjective:    Patient ID: April Mann, female    DOB: 09/20/1976, 41 y.o.   MRN: [id number]  HPI  Chief Complaint  Patient presents with  . Foot Pain    left foot and ankle pain, radiates to toes 2nd 3rd and 4th. Old injury - in her 20's she twisted the left ankle and fell on it while sleepwalking. Never received treatment  . Plantar Fasciitis    left foot, heel and arch/ off and on x 1 year   41 y.o. female presents with the above complaint.  Reports left foot and ankle pain for greater than 1 year.  Reports that in her 8s she fell and twisted her ankle while sleepwalking.  Complains of issues ever since.  States she has been exercising more recently in an effort to lose weight  Past Medical History:  Diagnosis Date  . Anemia   . Fibroid, uterine   . Hypertension    No past surgical history on file.  Current Outpatient Prescriptions:  .  phentermine 37.5 MG capsule, Take 37.5 mg by mouth every morning., Disp: , Rfl:  .  Fe Fum-FePoly-FA-Vit C-Vit B3 (INTEGRA F) 125-1 MG CAPS, Ok to give generic  Take one daily (Patient not taking: Reported on 10/30/2017), Disp: 30 capsule, Rfl: 5 .  meloxicam (MOBIC) 15 MG tablet, Take 1 tablet (15 mg total) by mouth daily., Disp: 30 tablet, Rfl: 0 .  MULTIPLE MINERALS-VITAMINS PO, Take 1 tablet by mouth daily., Disp: , Rfl:  .  triamterene-hydrochlorothiazide (MAXZIDE-25) 37.5-25 MG per tablet, Take 1 tablet by mouth daily. (Patient not taking: Reported on 10/30/2017), Disp: 90 tablet, Rfl: 1  No Known Allergies   Review of Systems  All other systems reviewed and are negative.     Objective:   Physical Exam Vitals:   10/30/17 1553  BP: 129/81  Pulse: 86   General AA&O x3. Normal mood and affect.  Vascular Dorsalis pedis and posterior tibial pulses  present 2+ bilaterally  Capillary refill normal to all digits. Pedal hair growth normal.  Neurologic Epicritic sensation grossly present.  Dermatologic No open lesions. Interspaces  clear of maceration. Nails well groomed and normal in appearance.  Orthopedic: MMT 5/5 in dorsiflexion, plantarflexion, inversion, and eversion. Normal joint ROM without pain or crepitus. Pain on palpation posterior tibial tendon left Slight pain on palpation ATFL left   Radiographs taken and reviewed. No nkle degenerative changes.  No acute fractures or dislocations.     Assessment & Plan:  She was evaluated and treated and all questions answered  Posterior tibial tendinitis left -XR reviewed as above. -Discussed benefits of weight loss to decrease stress on her tendon. -Scanned for CMOs today. Discussed with Velora Heckler.

## 2017-11-27 ENCOUNTER — Ambulatory Visit: Payer: BLUE CROSS/BLUE SHIELD | Admitting: Podiatry

## 2017-12-11 ENCOUNTER — Ambulatory Visit: Payer: BLUE CROSS/BLUE SHIELD | Admitting: Podiatry

## 2017-12-24 ENCOUNTER — Other Ambulatory Visit: Payer: Self-pay | Admitting: Obstetrics and Gynecology

## 2017-12-24 DIAGNOSIS — Z139 Encounter for screening, unspecified: Secondary | ICD-10-CM

## 2018-01-19 ENCOUNTER — Ambulatory Visit
Admission: RE | Admit: 2018-01-19 | Discharge: 2018-01-19 | Disposition: A | Discharge status: Home / Self Care | Payer: BLUE CROSS/BLUE SHIELD | Source: Ambulatory Visit | Attending: Obstetrics and Gynecology | Admitting: Obstetrics and Gynecology

## 2018-01-19 DIAGNOSIS — Z139 Encounter for screening, unspecified: Secondary | ICD-10-CM

## 2018-02-06 DIAGNOSIS — E221 Hyperprolactinemia: Secondary | ICD-10-CM | POA: Diagnosis not present

## 2018-02-06 DIAGNOSIS — Z5181 Encounter for therapeutic drug level monitoring: Secondary | ICD-10-CM | POA: Diagnosis not present

## 2018-02-06 DIAGNOSIS — N6452 Nipple discharge: Secondary | ICD-10-CM | POA: Diagnosis not present

## 2018-02-23 ENCOUNTER — Ambulatory Visit: Payer: BLUE CROSS/BLUE SHIELD | Admitting: Orthotics

## 2018-02-23 DIAGNOSIS — M76822 Posterior tibial tendinitis, left leg: Secondary | ICD-10-CM

## 2018-02-23 DIAGNOSIS — M2142 Flat foot [pes planus] (acquired), left foot: Secondary | ICD-10-CM

## 2018-02-23 NOTE — Progress Notes (Signed)
Patient came in today to pick up custom made foot orthotics.  The goals were accomplished and the patient reported no dissatisfaction with said orthotics.  Patient was advised of breakin period and how to report any issues. 

## 2018-03-01 DIAGNOSIS — R635 Abnormal weight gain: Secondary | ICD-10-CM | POA: Diagnosis not present

## 2018-11-24 ENCOUNTER — Encounter (HOSPITAL_COMMUNITY): Payer: Self-pay | Admitting: Emergency Medicine

## 2018-11-24 ENCOUNTER — Other Ambulatory Visit: Payer: Self-pay

## 2018-11-24 ENCOUNTER — Ambulatory Visit (INDEPENDENT_AMBULATORY_CARE_PROVIDER_SITE_OTHER): Payer: BLUE CROSS/BLUE SHIELD

## 2018-11-24 ENCOUNTER — Ambulatory Visit (HOSPITAL_COMMUNITY)
Admission: EM | Admit: 2018-11-24 | Discharge: 2018-11-24 | Disposition: A | Discharge status: Home / Self Care | Payer: BLUE CROSS/BLUE SHIELD | Attending: Family Medicine | Admitting: Family Medicine

## 2018-11-24 DIAGNOSIS — S8265XA Nondisplaced fracture of lateral malleolus of left fibula, initial encounter for closed fracture: Secondary | ICD-10-CM

## 2018-11-24 DIAGNOSIS — S82832A Other fracture of upper and lower end of left fibula, initial encounter for closed fracture: Secondary | ICD-10-CM | POA: Diagnosis not present

## 2018-11-24 MED ORDER — IBUPROFEN 600 MG PO TABS: 600.0000 mg | ORAL_TABLET | Freq: Four times a day (QID) | ORAL | 0 refills | Status: DC | PRN

## 2018-11-24 NOTE — ED Provider Notes (Signed)
April Mann    CSN: [id number] Arrival date & time: 11/24/18  5093     History   Chief Complaint Chief Complaint  Patient presents with  . Ankle Pain    HPI April Mann is a 42 y.o. female history of hypertension presenting today for evaluation of left ankle injury.  Patient was walking earlier this morning, believes she slipped on black ice.  Since she has had pain with weightbearing and swelling.  Denies previous issues with this ankle.  Denies numbness or tingling.  HPI  Past Medical History:  Diagnosis Date  . Anemia   . Fibroid, uterine   . Hypertension     Patient Active Problem List   Diagnosis Date Noted  . 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 Essential hypertension, benign 04/26/2013  . History of anemia 04/26/2013  . Thyroid nodule 04/26/2013  . Fibroid, uterine     History reviewed. No pertinent surgical history.  OB History    Gravida  2   Para      Term      Preterm      AB  2   Living  0     SAB      TAB      Ectopic      Multiple      Live Births               Home Medications    Prior to Admission medications   Medication Sig Start Date End Date Taking? Authorizing Provider  ibuprofen (ADVIL,MOTRIN) 600 MG tablet Take 1 tablet (600 mg total) by mouth every 6 (six) hours as needed. 11/24/18   April Mann, Elesa Hacker, PA-C    Family History Family History  Problem Relation Age of Onset  . Fibromyalgia Mother   . Lupus Maternal Grandmother   . Kidney disease Maternal Grandfather   . Diabetes Maternal Grandfather     Social History Social History   Tobacco Use  . Smoking status: Never Smoker  . Smokeless tobacco: Never Used  Substance Use Topics  . Alcohol use: No    Alcohol/week: 0.0 standard drinks  . Drug use: No     Allergies   Patient has no known allergies.   Review of Systems Review of Systems  Constitutional: Negative for fatigue and fever.  Eyes: Negative for visual disturbance.    Respiratory: Negative for shortness of breath.   Cardiovascular: Negative for chest pain.  Gastrointestinal: Negative for abdominal pain, nausea and vomiting.  Musculoskeletal: Positive for arthralgias, gait problem, joint swelling and myalgias.  Skin: Negative for color change, rash and wound.  Neurological: Negative for dizziness, weakness, light-headedness and headaches.     Physical Exam Triage Vital Signs ED Triage Vitals  Enc Vitals Group     BP 11/24/18 0841 (!) 142/62     Pulse Rate 11/24/18 0841 89     Resp 11/24/18 0841 15     Temp 11/24/18 0841 98 F (36.7 C)     Temp Source 11/24/18 0841 Oral     SpO2 11/24/18 0841 98 %     Weight --      Height --      Head Circumference --      Peak Flow --      Pain Score 11/24/18 0855 7     Pain Loc --      Pain Edu? --      Excl. in [place]? --    No data found.  Updated Vital  Signs BP (!) 142/62 (BP Location: Right Arm)   Pulse 89   Temp 98 F (36.7 C) (Oral)   Resp 15   SpO2 98%   Visual Acuity Right Eye Distance:   Left Eye Distance:   Bilateral Distance:    Right Eye Near:   Left Eye Near:    Bilateral Near:     Physical Exam  Constitutional: She is oriented to person, place, and time. She appears well-developed and well-nourished.  No acute distress  HENT:  Head: Normocephalic and atraumatic.  Nose: Nose normal.  Eyes: Conjunctivae are normal.  Neck: Neck supple.  Cardiovascular: Normal rate.  Pulmonary/Chest: Effort normal. No respiratory distress.  Abdominal: She exhibits no distension.  Musculoskeletal: Normal range of motion.  Left ankle: Significant swelling about lateral malleolus with tenderness, nontender over medial malleolus, nontender to fibular insertion at knee, nontender throughout dorsum of foot. Dorsalis pedis 2+ Cap refill less than 2 seconds, neurovascularly intact distally  Neurological: She is alert and oriented to person, place, and time.  Skin: Skin is warm and dry.   Psychiatric: She has a normal mood and affect.  Nursing note and vitals reviewed.    UC Treatments / Results  Labs (all labs ordered are listed, but only abnormal results are displayed) Labs Reviewed - No data to display  EKG None  Radiology Dg Ankle Complete Left  Result Date: 11/24/2018 CLINICAL DATA:  Recent fall with ankle pain, initial encounter EXAM: LEFT ANKLE COMPLETE - 3+ VIEW COMPARISON:  None. FINDINGS: Generalized soft tissue swelling is noted about the ankle. Oblique fracture through the distal fibula is noted with only minimal displacement. Mild calcaneal spurring is seen. No other focal abnormality is noted. IMPRESSION: Distal fibular fracture without significant displacement. Considerable soft tissue swelling is noted. Electronically Signed   By: Inez Catalina M.D.   On: 11/24/2018 09:24    Procedures Procedures (including critical care time)  Medications Ordered in UC Medications - No data to display  Initial Impression / Assessment and Plan / UC Course  I have reviewed the triage vital signs and the nursing notes.  Pertinent labs & imaging results that were available during my care of the patient were reviewed by me and considered in my medical decision making (see chart for details).    Fracture of lateral malleolus, nondisplaced, will immobilize and have patient follow-up with orthopedics.  Nonweightbearing until cleared by Ortho.  Tylenol and ibuprofen for pain and swelling.  Icing.  Cam walker applied with crutches.Discussed strict return precautions. Patient verbalized understanding and is agreeable with plan.  Final Clinical Impressions(s) / UC Diagnoses   Final diagnoses:  Closed nondisplaced fracture of lateral malleolus of left fibula, initial encounter     Discharge Instructions     Non weight bearing until cleared by orthopedics Follow up with Dr. Percell Miller below Use anti-inflammatories for pain/swelling. You may take up to 800 mg Ibuprofen  every 8 hours with food. You may supplement Ibuprofen with Tylenol [phone] mg every 8 hours.  Ice ankle    ED Prescriptions    Medication Sig Dispense Auth. Provider   ibuprofen (ADVIL,MOTRIN) 600 MG tablet Take 1 tablet (600 mg total) by mouth every 6 (six) hours as needed. 30 tablet Jonhatan Hearty, [place] C, PA-C     Controlled Substance Prescriptions [place] Controlled Substance Registry consulted? Not Applicable   Janith Lima, [place] 11/24/18 1017

## 2018-11-24 NOTE — ED Triage Notes (Signed)
Twisted left ankle this morning.  Was walking down a ramp.  Pedal pulses present.  Patient can wiggle toes on left foot

## 2018-11-24 NOTE — Discharge Instructions (Signed)
Non weight bearing until cleared by orthopedics Follow up with Dr. Percell Miller below Use anti-inflammatories for pain/swelling. You may take up to 800 mg Ibuprofen every 8 hours with food. You may supplement Ibuprofen with Tylenol [phone] mg every 8 hours.  Ice ankle

## 2018-11-30 DIAGNOSIS — S8265XA Nondisplaced fracture of lateral malleolus of left fibula, initial encounter for closed fracture: Secondary | ICD-10-CM | POA: Diagnosis not present

## 2019-01-26 ENCOUNTER — Other Ambulatory Visit: Payer: Self-pay | Admitting: Obstetrics and Gynecology

## 2019-01-26 DIAGNOSIS — Z1231 Encounter for screening mammogram for malignant neoplasm of breast: Secondary | ICD-10-CM

## 2019-02-05 DIAGNOSIS — R635 Abnormal weight gain: Secondary | ICD-10-CM | POA: Diagnosis not present

## 2019-02-05 DIAGNOSIS — R5383 Other fatigue: Secondary | ICD-10-CM | POA: Diagnosis not present

## 2019-02-05 DIAGNOSIS — E8881 Metabolic syndrome: Secondary | ICD-10-CM | POA: Diagnosis not present

## 2019-02-05 DIAGNOSIS — Z131 Encounter for screening for diabetes mellitus: Secondary | ICD-10-CM | POA: Diagnosis not present

## 2019-02-05 DIAGNOSIS — R0602 Shortness of breath: Secondary | ICD-10-CM | POA: Diagnosis not present

## 2019-02-05 DIAGNOSIS — D520 Dietary folate deficiency anemia: Secondary | ICD-10-CM | POA: Diagnosis not present

## 2019-02-05 DIAGNOSIS — E559 Vitamin D deficiency, unspecified: Secondary | ICD-10-CM | POA: Diagnosis not present

## 2019-02-05 DIAGNOSIS — I10 Essential (primary) hypertension: Secondary | ICD-10-CM | POA: Diagnosis not present

## 2019-02-05 DIAGNOSIS — Z79899 Other long term (current) drug therapy: Secondary | ICD-10-CM | POA: Diagnosis not present

## 2019-02-05 DIAGNOSIS — E7800 Pure hypercholesterolemia, unspecified: Secondary | ICD-10-CM | POA: Diagnosis not present

## 2019-02-05 DIAGNOSIS — N915 Oligomenorrhea, unspecified: Secondary | ICD-10-CM | POA: Diagnosis not present

## 2019-02-22 ENCOUNTER — Ambulatory Visit
Admission: RE | Admit: 2019-02-22 | Discharge: 2019-02-22 | Disposition: A | Discharge status: Home / Self Care | Payer: BLUE CROSS/BLUE SHIELD | Source: Ambulatory Visit | Attending: Obstetrics and Gynecology | Admitting: Obstetrics and Gynecology

## 2019-02-22 DIAGNOSIS — Z1231 Encounter for screening mammogram for malignant neoplasm of breast: Secondary | ICD-10-CM

## 2019-02-24 DIAGNOSIS — D259 Leiomyoma of uterus, unspecified: Secondary | ICD-10-CM | POA: Diagnosis not present

## 2019-02-24 DIAGNOSIS — Z01419 Encounter for gynecological examination (general) (routine) without abnormal findings: Secondary | ICD-10-CM | POA: Diagnosis not present

## 2019-02-24 DIAGNOSIS — Z1389 Encounter for screening for other disorder: Secondary | ICD-10-CM | POA: Diagnosis not present

## 2019-02-24 DIAGNOSIS — Z130 Encounter for screening for diseases of the blood and blood-forming organs and certain disorders involving the immune mechanism: Secondary | ICD-10-CM | POA: Diagnosis not present

## 2019-03-05 DIAGNOSIS — Z1329 Encounter for screening for other suspected endocrine disorder: Secondary | ICD-10-CM | POA: Diagnosis not present

## 2019-03-05 DIAGNOSIS — Z131 Encounter for screening for diabetes mellitus: Secondary | ICD-10-CM | POA: Diagnosis not present

## 2019-03-05 DIAGNOSIS — Z136 Encounter for screening for cardiovascular disorders: Secondary | ICD-10-CM | POA: Diagnosis not present

## 2019-03-05 DIAGNOSIS — Z0001 Encounter for general adult medical examination with abnormal findings: Secondary | ICD-10-CM | POA: Diagnosis not present

## 2019-03-05 DIAGNOSIS — Z01118 Encounter for examination of ears and hearing with other abnormal findings: Secondary | ICD-10-CM | POA: Diagnosis not present

## 2019-03-05 DIAGNOSIS — I10 Essential (primary) hypertension: Secondary | ICD-10-CM | POA: Diagnosis not present

## 2019-03-12 DIAGNOSIS — D259 Leiomyoma of uterus, unspecified: Secondary | ICD-10-CM | POA: Diagnosis not present

## 2019-03-18 DIAGNOSIS — J301 Allergic rhinitis due to pollen: Secondary | ICD-10-CM | POA: Diagnosis not present

## 2019-03-18 DIAGNOSIS — J3081 Allergic rhinitis due to animal (cat) (dog) hair and dander: Secondary | ICD-10-CM | POA: Diagnosis not present

## 2019-03-18 DIAGNOSIS — J3089 Other allergic rhinitis: Secondary | ICD-10-CM | POA: Diagnosis not present

## 2019-03-18 DIAGNOSIS — R05 Cough: Secondary | ICD-10-CM | POA: Diagnosis not present

## 2019-04-19 ENCOUNTER — Ambulatory Visit: Payer: BLUE CROSS/BLUE SHIELD | Admitting: Endocrinology

## 2019-08-03 ENCOUNTER — Ambulatory Visit: Payer: BC Managed Care – PPO | Admitting: Endocrinology

## 2019-08-03 ENCOUNTER — Other Ambulatory Visit: Payer: Self-pay

## 2019-08-03 ENCOUNTER — Encounter: Payer: Self-pay | Admitting: Endocrinology

## 2019-08-03 VITALS — BP 132/84 | HR 95 | Ht 62.0 in | Wt 308.0 lb

## 2019-08-03 DIAGNOSIS — R5383 Other fatigue: Secondary | ICD-10-CM | POA: Diagnosis not present

## 2019-08-03 DIAGNOSIS — N911 Secondary amenorrhea: Secondary | ICD-10-CM

## 2019-08-03 DIAGNOSIS — D352 Benign neoplasm of pituitary gland: Secondary | ICD-10-CM | POA: Diagnosis not present

## 2019-08-03 LAB — BASIC METABOLIC PANEL
BUN: 10 mg/dL (ref 6–23)
CO2: 27 mEq/L (ref 19–32)
Calcium: 9.7 mg/dL (ref 8.4–10.5)
Chloride: 105 mEq/L (ref 96–112)
Creatinine, Ser: 0.69 mg/dL (ref 0.40–1.20)
GFR: 111.91 mL/min (ref >60.00)
Glucose, Bld: 111 mg/dL — ABNORMAL HIGH (ref 70–99)
Potassium: 3.7 mEq/L (ref 3.5–5.1)
Sodium: 139 mEq/L (ref 135–145)

## 2019-08-03 LAB — TSH: TSH: 0.56 u[IU]/mL (ref 0.35–4.50)

## 2019-08-03 LAB — T4, FREE: Free T4: 0.96 ng/dL (ref 0.60–1.60)

## 2019-08-03 NOTE — Progress Notes (Signed)
Referring : Dr. Alphonzo Dublin  Chief complaint: Irregular menstrual cycles  History of Present Illness   She has had missed menstrual cycles since the age of 48 She does also complain of periodic  milky breast discharge for several years, likely about 10 years She does not complain of any unusual headaches or change in vision  Prolactin levels: 154, done on 07/19/2017 with 14% macro prolactin 02/06/2018 prolactin 43.3  No results found for: PROLACTIN  No prior estradiol levels available  MRI of pituitary gland done in 2013 shows 4 x 6 x 6 mm area of differential enhancement left side of the pituitary gland consistent with a prolactinoma. This has increased in size from prior study of 2009.  Patient was treated with cabergoline and late 2018, likely she took the medication only for a few weeks However she thinks it caused side effects like dizziness and nausea Also while taking the medication she will would be having regular menstrual cycles but this would be very heavy for at least a couple of days which she found very distressful and stopped her medication Not clear if she was ever offered bromocriptine, no detailed records are available about this condition from her physicians  Since she has not taken any medication for over a year she has not had any regular menstrual cycles and none this year Usually has about 5-7 menstrual cycles per year  She was evaluated in detail by endocrinologist in 2018 and also had pituitary hormones, cortisol, 17 hydroxyprogesterone and total testosterone checked which were normal   Allergies as of 08/03/2019   No Known Allergies     Medication List       Accurate as of August 03, 2019  9:08 AM. If you have any questions, ask your nurse or doctor.        STOP taking these medications   ibuprofen 600 MG tablet Commonly known as: ADVIL Stopped by: Elayne Snare, MD     TAKE these medications   albuterol 108 (90 Base) MCG/ACT inhaler  Commonly known as: VENTOLIN HFA Inhale 2 puffs into the lungs 3 times/day as needed-between meals & bedtime for wheezing or shortness of breath.   fluticasone 50 MCG/ACT nasal spray Commonly known as: FLONASE Place 2 sprays into both nostrils daily as needed for allergies or rhinitis.       Allergies: No Known Allergies  Past Medical History:  Diagnosis Date  . Anemia   . Fibroid, uterine   . Hypertension     History reviewed. No pertinent surgical history.  Family History  Problem Relation Age of Onset  . Fibromyalgia Mother   . Lupus Maternal Grandmother   . Kidney disease Maternal Grandfather   . Diabetes Maternal Grandfather     Social History:  reports that she has never smoked. She has never used smokeless tobacco. She reports that she does not drink alcohol or use drugs.   Review of Systems  Constitutional:       Recently weight is stable although in 2019 she had lost 30 pounds which she had regained subsequently  HENT: Negative for headaches.   Eyes: Negative for visual disturbance.  Respiratory: Negative for shortness of breath.        Has history of mild asthma  Cardiovascular: Negative for leg swelling.  Gastrointestinal: Negative for constipation and diarrhea.  Endocrine:       Does not complain of facial hair or acne She tends to have mild chronic fatigue  Genitourinary:  Negative for frequency.  Musculoskeletal: Negative for joint pain.  Skin: Negative for rash and abnormal pigmentation.  Neurological: Negative for weakness, numbness and tingling.   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 and previous history of galactorrhea She also has an empty sella concomitantly on her last MRI No recent symptoms of headache suggesting significant enlargement of her microadenoma  Clinically no signs or symptoms of hypopituitarism but she does have fatigue and secondary hypothyroidism will need to be ruled out which has not been checked for before  ACANTHOSIS indicating insulin resistance syndrome Although total testosterone was normal in 2018 free testosterone has not been done    PLAN:     Check prolactin level today We will also check estradiol level to assess level of pituitary function  Since she is premenopausal and does need control of her hyperprolactinemia may consider low doses of bromocriptine to start with Goal would be to have some restoration of her menstrual cycles without causing excessive bleeding Since she may have excessive bleeding from current uterine fibroids will defer any management to the gynecologist also  She does need to have assessment of her pituitary function because of her empty sella syndrome at least with thyroid levels and IGF-I She does not have any clinical features of adrenal insufficiency  She does need to have regular screening for diabetes because of her acanthosis and will check her glucose today Would also consider fasting free testosterone level on her next visit  Copy of the consultation has been sent to the referring PCP  Elayne Snare 08/03/19   Note: This office note was prepared with Dragon voice recognition system technology. Any transcriptional errors that result from this process are unintentional.

## 2019-08-04 LAB — ESTRADIOL: Estradiol: 17.8 pg/mL

## 2019-08-04 LAB — PROLACTIN: Prolactin: 127 ng/mL — ABNORMAL HIGH (ref 4.8–23.3)

## 2019-08-04 LAB — INSULIN-LIKE GROWTH FACTOR: Insulin-Like GF-1: 130 ng/mL (ref 74–239)

## 2019-08-05 ENCOUNTER — Other Ambulatory Visit: Payer: Self-pay | Admitting: Endocrinology

## 2019-08-05 MED ORDER — BROMOCRIPTINE MESYLATE 2.5 MG PO TABS: 1.2500 mg | ORAL_TABLET | Freq: Every day | ORAL | 2 refills | Status: DC

## 2019-08-06 ENCOUNTER — Other Ambulatory Visit: Payer: Self-pay

## 2019-08-06 MED ORDER — BROMOCRIPTINE MESYLATE 2.5 MG PO TABS: 1.2500 mg | ORAL_TABLET | Freq: Every day | ORAL | 2 refills | Status: DC

## 2019-08-09 ENCOUNTER — Telehealth: Payer: Self-pay | Admitting: Endocrinology

## 2019-08-09 NOTE — Telephone Encounter (Signed)
error 

## 2019-09-21 ENCOUNTER — Other Ambulatory Visit: Payer: Self-pay

## 2019-09-21 ENCOUNTER — Other Ambulatory Visit (INDEPENDENT_AMBULATORY_CARE_PROVIDER_SITE_OTHER): Payer: BC Managed Care – PPO

## 2019-09-21 DIAGNOSIS — N911 Secondary amenorrhea: Secondary | ICD-10-CM | POA: Diagnosis not present

## 2019-09-21 DIAGNOSIS — D352 Benign neoplasm of pituitary gland: Secondary | ICD-10-CM

## 2019-09-24 ENCOUNTER — Encounter: Payer: Self-pay | Admitting: Endocrinology

## 2019-09-24 ENCOUNTER — Ambulatory Visit (INDEPENDENT_AMBULATORY_CARE_PROVIDER_SITE_OTHER): Payer: BC Managed Care – PPO | Admitting: Endocrinology

## 2019-09-24 ENCOUNTER — Other Ambulatory Visit: Payer: Self-pay

## 2019-09-24 DIAGNOSIS — R7301 Impaired fasting glucose: Secondary | ICD-10-CM

## 2019-09-24 DIAGNOSIS — D352 Benign neoplasm of pituitary gland: Secondary | ICD-10-CM | POA: Diagnosis not present

## 2019-09-24 LAB — TESTOSTERONE, FREE, TOTAL, SHBG
Sex Hormone Binding: 45.6 nmol/L (ref 24.6–122.0)
Testosterone, Free: 1.5 pg/mL (ref 0.0–4.2)
Testosterone: 18 ng/dL (ref 8–48)

## 2019-09-24 LAB — PROLACTIN: Prolactin: 124 ng/mL — ABNORMAL HIGH (ref 4.8–23.3)

## 2019-09-24 MED ORDER — BROMOCRIPTINE MESYLATE 2.5 MG PO TABS: ORAL_TABLET | ORAL | 1 refills | Status: DC

## 2019-09-24 NOTE — Progress Notes (Signed)
Referring : Dr. Alphonzo Dublin  Chief complaint: Irregular menstrual cycles  History of Present Illness  Today's office visit was provided via telemedicine using video technique The patient was explained the limitations of evaluation and management by telemedicine and the availability of in person appointments.  The patient understood the limitations and agreed to proceed. Patient also understood that the telehealth visit is billable. . Location of the patient: Patient's home . Location of the provider: Physician office Only the patient and myself were participating in the encounter     She has had missed menstrual cycles since the age of 24 Usually has menstrual cycles 5-7 times a year She does also complain of periodic  milky breast discharge for several years, likely about 10 years She does not complain of any unusual headaches or change in vision  She was evaluated in detail by an endocrinologist in 2018 and also had pituitary hormones, cortisol, 17 hydroxyprogesterone and total testosterone checked which were normal  Prolactin levels: 154, done on 07/19/2017 with 14% macro prolactin 02/06/2018 prolactin 43.3  Lab Results  Component Value Date   PROLACTIN 124.0 (H) 09/21/2019   PROLACTIN 127.0 (H) 08/03/2019      MRI of pituitary gland done in 2013 shows 4 x 6 x 6 mm area of differential enhancement left side of the pituitary gland consistent with a prolactinoma. This has increased in size from prior study of 2009.  Patient was treated with cabergoline in late 2018, likely she took the medication only for a few weeks However she thinks it caused side effects like dizziness and nausea Also while taking the medication she will would be having regular menstrual cycles but this would be very heavy for at least a couple of days which she found very distressful and stopped her medication Not clear if she was ever offered bromocriptine, no detailed records are available  about this condition from her physicians  RECENT HISTORY:  In the last year has had about 3-4 menstrual cycles and did have a cycle last month  Because of her reported intolerance to cabergoline she is on a trial of bromocriptine, 2.5 mg and is taking half tablet at bedtime since her visit in August 2020 She has had only minimal dizziness with this with taking this at bedtime However prolactin is about the same  Other labs: Estradiol level low normal at 17.8 in August Free testosterone level normal  Allergies as of 09/24/2019   No Known Allergies     Medication List       Accurate as of September 24, 2019  9:23 AM. If you have any questions, ask your nurse or doctor.        albuterol 108 (90 Base) MCG/ACT inhaler Commonly known as: VENTOLIN HFA Inhale 2 puffs into the lungs 3 times/day as needed-between meals & bedtime for wheezing or shortness of breath.   bromocriptine 2.5 MG tablet Commonly known as: PARLODEL Take 0.5 tablets (1.25 mg total) by mouth at bedtime.   fluticasone 50 MCG/ACT nasal spray Commonly known as: FLONASE Place 2 sprays into both nostrils daily as needed for allergies or rhinitis.       Allergies: No Known Allergies  Past Medical History:  Diagnosis Date  . Anemia   . Fibroid, uterine   . Hypertension     History reviewed. No pertinent surgical history.  Family History  Problem Relation Age of Onset  . Fibromyalgia Mother   . Lupus Maternal Grandmother   .  Hypertension Maternal Grandmother   . Kidney disease Maternal Grandfather   . Diabetes Maternal Grandfather   . Asthma Father   . Hypertension Paternal Grandmother     Social History:  reports that she has never smoked. She has never used smokeless tobacco. She reports that she does not drink alcohol or use drugs.   Review of Systems   Thyroid nodule has been a previous diagnosis but no thyroid ultrasound reports available No thyroid dysfunction despite symptoms of fatigue   Lab Results  Component Value Date   TSH 0.56 08/03/2019   TSH 0.509 04/05/2015   TSH 0.277 (L) 03/24/2014   FREET4 0.96 08/03/2019   FREET4 1.12 04/05/2015   FREET4 1.09 03/24/2014     EXAM:  There were no vitals taken for this visit.  Physical Exam    ASSESSMENT:     Prolactinoma with 6 mm lesion and significant symptoms of oligomenorrhea and previous history of galactorrhea She also has an empty sella concomitantly on her last MRI  She is so far tolerating 2.5 mg, half tablet bromocriptine at bedtime However there is no change in her prolactin level Has not had any evidence of hypopituitarism otherwise  ACANTHOSIS indicating insulin resistance syndrome Last glucose 111 and will need follow-up with A1c     PLAN:     Bromocriptine 2.5 mg at night She will again increase the dose by half tablet after 1 week and in 14 days go up to 2 tablets at night if tolerated, next prescription can be 5 mg tablets if needed  Follow-up with labs in 2 months including fasting glucose and A1c   Elayne Snare 09/24/19   Note: This office note was prepared with Dragon voice recognition system technology. Any transcriptional errors that result from this process are unintentional.

## 2019-11-29 ENCOUNTER — Other Ambulatory Visit: Payer: Self-pay

## 2019-11-29 ENCOUNTER — Other Ambulatory Visit (INDEPENDENT_AMBULATORY_CARE_PROVIDER_SITE_OTHER): Payer: BC Managed Care – PPO

## 2019-11-29 DIAGNOSIS — D352 Benign neoplasm of pituitary gland: Secondary | ICD-10-CM

## 2019-11-29 DIAGNOSIS — R7301 Impaired fasting glucose: Secondary | ICD-10-CM

## 2019-11-29 LAB — HEMOGLOBIN A1C: Hgb A1c MFr Bld: 5.8 % (ref 4.6–6.5)

## 2019-11-29 LAB — GLUCOSE, RANDOM: Glucose, Bld: 107 mg/dL — ABNORMAL HIGH (ref 70–99)

## 2019-11-30 LAB — PROLACTIN: Prolactin: 186 ng/mL — ABNORMAL HIGH (ref 4.8–23.3)

## 2019-12-02 ENCOUNTER — Other Ambulatory Visit: Payer: Self-pay

## 2019-12-02 ENCOUNTER — Telehealth: Payer: Self-pay | Admitting: Endocrinology

## 2019-12-02 ENCOUNTER — Encounter: Payer: Self-pay | Admitting: Endocrinology

## 2019-12-02 ENCOUNTER — Ambulatory Visit (INDEPENDENT_AMBULATORY_CARE_PROVIDER_SITE_OTHER): Payer: BC Managed Care – PPO | Admitting: Endocrinology

## 2019-12-02 DIAGNOSIS — D352 Benign neoplasm of pituitary gland: Secondary | ICD-10-CM

## 2019-12-02 DIAGNOSIS — R7301 Impaired fasting glucose: Secondary | ICD-10-CM | POA: Diagnosis not present

## 2019-12-02 MED ORDER — CABERGOLINE 0.5 MG PO TABS: 0.2500 mg | ORAL_TABLET | ORAL | 1 refills | Status: DC

## 2019-12-02 MED ORDER — CABERGOLINE 0.5 MG PO TABS: 0.2500 mg | ORAL_TABLET | ORAL | 0 refills | Status: DC

## 2019-12-02 NOTE — Progress Notes (Signed)
Kerly Wears 1976/06/09   Referring : Dr. Alphonzo Dublin  I connected with the above-named patient by video enabled telemedicine application and verified that I am speaking with the correct person. The patient was explained the limitations of evaluation and management by telemedicine and the availability of in person appointments.  Patient also understood that there may be a patient responsible charge related to this service . Location of the patient: Patient's home . Location of the provider: Physician office Only the patient and myself were participating in the encounter The patient understood the above statements and agreed to proceed.  Chief complaint: Irregular menstrual cycles  History of Present Illness    She has had missed menstrual cycles since the age of 81 Usually has menstrual cycles 5-7 times a year She does also complain of periodic  milky breast discharge for several years, likely about 10 years She does not complain of any unusual headaches or change in vision  She was evaluated in detail by an endocrinologist in 2018 and also had pituitary hormones, cortisol, 17 hydroxyprogesterone and total testosterone checked which were normal  Patient was treated with cabergoline in late 2018, likely she took the medication only for a few weeks However she thinks it caused side effects like dizziness and nausea Also while taking the medication she will would be having regular menstrual cycles but this would be very heavy for at least a couple of days which she found very distressful and stopped her medication Not clear if she was ever offered bromocriptine, no detailed records are available about this condition from her physicians  Prolactin levels: 154, done on 07/19/2017 with 14% macro prolactin 02/06/2018 prolactin 43.3  Lab Results  Component Value Date   PROLACTIN 186.0 (H) 11/29/2019   PROLACTIN 124.0 (H) 09/21/2019   PROLACTIN 127.0 (H) 08/03/2019      RECENT HISTORY:   Because of her reported intolerance to cabergoline she had been tried on bromocriptine However with this she could not tolerate more than half a tablet, initially started in 07/2019 With the whole tablet she gets nausea and headache Also with the half tablet her prolactin did not improve  She thinks she is having menstrual cycles every month now Also last 2 weeks has occasional milk discharge from the breasts No headaches  She did miss her medication for 2 days prior to her labs and prolactin is higher as above  Other labs: Estradiol level low normal at 17.8 in August Free testosterone level normal  MRI of pituitary gland done in 2013 shows 4 x 6 x 6 mm area of differential enhancement left side of the pituitary gland consistent with a prolactinoma. This has increased in size from prior study of 2009.    Allergies as of 12/02/2019   No Known Allergies     Medication List       Accurate as of December 02, 2019  8:28 AM. If you have any questions, ask your nurse or doctor.        albuterol 108 (90 Base) MCG/ACT inhaler Commonly known as: VENTOLIN HFA Inhale 2 puffs into the lungs 3 times/day as needed-between meals & bedtime for wheezing or shortness of breath.   bromocriptine 2.5 MG tablet Commonly known as: PARLODEL 1-1/2 tablets at night, increase dosage as directed   fluticasone 50 MCG/ACT nasal spray Commonly known as: FLONASE Place 2 sprays into both nostrils daily as needed for allergies or rhinitis.       Allergies: No Known Allergies  Past Medical History:  Diagnosis Date  . Anemia   . Fibroid, uterine   . Hypertension     History reviewed. No pertinent surgical history.  Family History  Problem Relation Age of Onset  . Fibromyalgia Mother   . Lupus Maternal Grandmother   . Hypertension Maternal Grandmother   . Kidney disease Maternal Grandfather   . Diabetes Maternal Grandfather   . Asthma Father   . Hypertension Paternal  Grandmother     Social History:  reports that she has never smoked. She has never used smokeless tobacco. She reports that she does not drink alcohol or use drugs.   Review of Systems  Thyroid nodule: This has been a previous diagnosis but no thyroid ultrasound reports available No thyroid dysfunction despite symptoms of fatigue  Lab Results  Component Value Date   TSH 0.56 08/03/2019   TSH 0.509 04/05/2015   TSH 0.277 (L) 03/24/2014   FREET4 0.96 08/03/2019   FREET4 1.12 04/05/2015   FREET4 1.09 03/24/2014   Impaired fasting glucose: This is now 107 compared to 111 She does not have any exercise regimen or any diet currently Has not had baseline A1c previously  Lab Results  Component Value Date   HGBA1C 5.8 11/29/2019   Lab Results  Component Value Date   LDLCALC 110 (H) 04/05/2015   CREATININE 0.69 08/03/2019     EXAM:  There were no vitals taken for this visit.  Physical Exam    ASSESSMENT:     Prolactinoma with 6 mm lesion and symptoms of oligomenorrhea and history of galactorrhea She also has an empty sella concomitantly on her last MRI  Although she thinks she is having menstrual cycles weekly she is not able to get her prolactin level controlled and this appears to be higher She cannot tolerate more than 1.25 mg of bromocriptine  Previously has not had any evidence of hypopituitarism but also had a low normal estradiol level  Impaired fasting glucose with A1c 5.8, no increase in fasting glucose She is not understanding the implications of prediabetes and need to lose weight     PLAN:     Switch to cabergoline She will try half tablet at night twice a week If she is not able to tolerate the twice a week frequency she can extend the interval based on her tolerance We will consider increasing the dose on the next visit if tolerated  Follow-up with labs in 2 months Consider MRI if she continues to have significantly high prolactin   Elayne Snare  12/02/19   Note: This office note was prepared with Dragon voice recognition system technology. Any transcriptional errors that result from this process are unintentional.

## 2019-12-02 NOTE — Telephone Encounter (Signed)
#  1 - patient faxed pharmacy form to [facility] and wanted to confirm receipt  #2 - patient wanted to see if we had samples of the new medication, - Carergoline. Dr Dwyane Dee prescribed this today because her mail-in pharmacy (she is required to use) said it would be 10-14 days before they could get it to her   Patients call back number is [phone]

## 2019-12-02 NOTE — Telephone Encounter (Signed)
Called pt back to tell her that we have not received anything with her name on it as of this time. Pt stated that it was a form to fill the medication. Pt was informed that we do not need this because it can be sent electronically. She was okay with this.  Rx was sent in 90 day supply to [facility].

## 2020-02-09 ENCOUNTER — Other Ambulatory Visit: Payer: Self-pay | Admitting: Obstetrics and Gynecology

## 2020-02-09 DIAGNOSIS — Z1231 Encounter for screening mammogram for malignant neoplasm of breast: Secondary | ICD-10-CM

## 2020-03-08 ENCOUNTER — Ambulatory Visit
Admission: RE | Admit: 2020-03-08 | Discharge: 2020-03-08 | Disposition: A | Discharge status: Home / Self Care | Payer: BC Managed Care – PPO | Source: Ambulatory Visit | Attending: Obstetrics and Gynecology | Admitting: Obstetrics and Gynecology

## 2020-03-08 ENCOUNTER — Other Ambulatory Visit: Payer: Self-pay

## 2020-03-08 DIAGNOSIS — Z1231 Encounter for screening mammogram for malignant neoplasm of breast: Secondary | ICD-10-CM

## 2020-05-09 DIAGNOSIS — E538 Deficiency of other specified B group vitamins: Secondary | ICD-10-CM | POA: Diagnosis not present

## 2020-05-09 DIAGNOSIS — E559 Vitamin D deficiency, unspecified: Secondary | ICD-10-CM | POA: Diagnosis not present

## 2020-05-09 DIAGNOSIS — N915 Oligomenorrhea, unspecified: Secondary | ICD-10-CM | POA: Diagnosis not present

## 2020-05-09 DIAGNOSIS — Z6841 Body Mass Index (BMI) 40.0 and over, adult: Secondary | ICD-10-CM | POA: Diagnosis not present

## 2020-06-14 DIAGNOSIS — R7303 Prediabetes: Secondary | ICD-10-CM | POA: Diagnosis not present

## 2020-06-14 DIAGNOSIS — Z79899 Other long term (current) drug therapy: Secondary | ICD-10-CM | POA: Diagnosis not present

## 2020-06-14 DIAGNOSIS — N915 Oligomenorrhea, unspecified: Secondary | ICD-10-CM | POA: Diagnosis not present

## 2020-07-14 DIAGNOSIS — N915 Oligomenorrhea, unspecified: Secondary | ICD-10-CM | POA: Diagnosis not present

## 2020-12-01 ENCOUNTER — Other Ambulatory Visit: Payer: Self-pay | Admitting: Obstetrics and Gynecology

## 2020-12-01 DIAGNOSIS — Z1231 Encounter for screening mammogram for malignant neoplasm of breast: Secondary | ICD-10-CM

## 2021-01-29 DIAGNOSIS — E559 Vitamin D deficiency, unspecified: Secondary | ICD-10-CM | POA: Diagnosis not present

## 2021-01-29 DIAGNOSIS — N951 Menopausal and female climacteric states: Secondary | ICD-10-CM | POA: Diagnosis not present

## 2021-01-29 DIAGNOSIS — R7309 Other abnormal glucose: Secondary | ICD-10-CM | POA: Diagnosis not present

## 2021-01-29 DIAGNOSIS — R635 Abnormal weight gain: Secondary | ICD-10-CM | POA: Diagnosis not present

## 2021-01-29 DIAGNOSIS — Z789 Other specified health status: Secondary | ICD-10-CM | POA: Diagnosis not present

## 2021-01-29 DIAGNOSIS — D649 Anemia, unspecified: Secondary | ICD-10-CM | POA: Diagnosis not present

## 2021-01-31 DIAGNOSIS — Z1331 Encounter for screening for depression: Secondary | ICD-10-CM | POA: Diagnosis not present

## 2021-01-31 DIAGNOSIS — R7309 Other abnormal glucose: Secondary | ICD-10-CM | POA: Diagnosis not present

## 2021-01-31 DIAGNOSIS — E282 Polycystic ovarian syndrome: Secondary | ICD-10-CM | POA: Diagnosis not present

## 2021-01-31 DIAGNOSIS — Z1339 Encounter for screening examination for other mental health and behavioral disorders: Secondary | ICD-10-CM | POA: Diagnosis not present

## 2021-01-31 DIAGNOSIS — R635 Abnormal weight gain: Secondary | ICD-10-CM | POA: Diagnosis not present

## 2021-01-31 DIAGNOSIS — T7840XD Allergy, unspecified, subsequent encounter: Secondary | ICD-10-CM | POA: Diagnosis not present

## 2021-02-14 DIAGNOSIS — Z6841 Body Mass Index (BMI) 40.0 and over, adult: Secondary | ICD-10-CM | POA: Diagnosis not present

## 2021-02-14 DIAGNOSIS — R030 Elevated blood-pressure reading, without diagnosis of hypertension: Secondary | ICD-10-CM | POA: Diagnosis not present

## 2021-02-14 DIAGNOSIS — R7309 Other abnormal glucose: Secondary | ICD-10-CM | POA: Diagnosis not present

## 2021-03-09 ENCOUNTER — Ambulatory Visit: Payer: BC Managed Care – PPO

## 2021-03-26 DIAGNOSIS — J301 Allergic rhinitis due to pollen: Secondary | ICD-10-CM | POA: Diagnosis not present

## 2021-03-26 DIAGNOSIS — J3089 Other allergic rhinitis: Secondary | ICD-10-CM | POA: Diagnosis not present

## 2021-03-26 DIAGNOSIS — Z91013 Allergy to seafood: Secondary | ICD-10-CM | POA: Diagnosis not present

## 2021-03-26 DIAGNOSIS — J3081 Allergic rhinitis due to animal (cat) (dog) hair and dander: Secondary | ICD-10-CM | POA: Diagnosis not present

## 2021-04-09 ENCOUNTER — Ambulatory Visit
Admission: RE | Admit: 2021-04-09 | Discharge: 2021-04-09 | Disposition: A | Discharge status: Home / Self Care | Payer: BC Managed Care – PPO | Source: Ambulatory Visit | Attending: Obstetrics and Gynecology | Admitting: Obstetrics and Gynecology

## 2021-04-09 ENCOUNTER — Other Ambulatory Visit: Payer: Self-pay

## 2021-04-09 DIAGNOSIS — Z1231 Encounter for screening mammogram for malignant neoplasm of breast: Secondary | ICD-10-CM

## 2021-04-27 ENCOUNTER — Inpatient Hospital Stay: Admission: RE | Admit: 2021-04-27 | Payer: BC Managed Care – PPO | Source: Ambulatory Visit

## 2021-07-18 ENCOUNTER — Other Ambulatory Visit: Payer: Self-pay | Admitting: Family Medicine

## 2021-07-18 DIAGNOSIS — E041 Nontoxic single thyroid nodule: Secondary | ICD-10-CM

## 2021-07-18 DIAGNOSIS — Z0000 Encounter for general adult medical examination without abnormal findings: Secondary | ICD-10-CM | POA: Diagnosis not present

## 2021-07-18 DIAGNOSIS — Z13220 Encounter for screening for lipoid disorders: Secondary | ICD-10-CM | POA: Diagnosis not present

## 2021-07-18 DIAGNOSIS — Z1211 Encounter for screening for malignant neoplasm of colon: Secondary | ICD-10-CM | POA: Diagnosis not present

## 2021-07-18 DIAGNOSIS — I10 Essential (primary) hypertension: Secondary | ICD-10-CM | POA: Diagnosis not present

## 2021-07-30 ENCOUNTER — Other Ambulatory Visit: Payer: BC Managed Care – PPO

## 2021-08-01 DIAGNOSIS — I10 Essential (primary) hypertension: Secondary | ICD-10-CM | POA: Diagnosis not present

## 2021-08-01 DIAGNOSIS — Z124 Encounter for screening for malignant neoplasm of cervix: Secondary | ICD-10-CM | POA: Diagnosis not present

## 2021-08-01 DIAGNOSIS — Z130 Encounter for screening for diseases of the blood and blood-forming organs and certain disorders involving the immune mechanism: Secondary | ICD-10-CM | POA: Diagnosis not present

## 2021-08-01 DIAGNOSIS — E669 Obesity, unspecified: Secondary | ICD-10-CM | POA: Diagnosis not present

## 2021-08-01 DIAGNOSIS — Z113 Encounter for screening for infections with a predominantly sexual mode of transmission: Secondary | ICD-10-CM | POA: Diagnosis not present

## 2021-08-01 DIAGNOSIS — E221 Hyperprolactinemia: Secondary | ICD-10-CM | POA: Diagnosis not present

## 2021-08-01 DIAGNOSIS — Z01419 Encounter for gynecological examination (general) (routine) without abnormal findings: Secondary | ICD-10-CM | POA: Diagnosis not present

## 2021-08-01 DIAGNOSIS — Z1389 Encounter for screening for other disorder: Secondary | ICD-10-CM | POA: Diagnosis not present

## 2021-08-14 ENCOUNTER — Other Ambulatory Visit: Payer: BC Managed Care – PPO

## 2021-10-03 DIAGNOSIS — F4323 Adjustment disorder with mixed anxiety and depressed mood: Secondary | ICD-10-CM | POA: Diagnosis not present

## 2021-10-12 ENCOUNTER — Other Ambulatory Visit: Payer: BC Managed Care – PPO

## 2021-10-17 DIAGNOSIS — F4323 Adjustment disorder with mixed anxiety and depressed mood: Secondary | ICD-10-CM | POA: Diagnosis not present

## 2021-11-07 DIAGNOSIS — F4323 Adjustment disorder with mixed anxiety and depressed mood: Secondary | ICD-10-CM | POA: Diagnosis not present

## 2021-11-16 DIAGNOSIS — Z0289 Encounter for other administrative examinations: Secondary | ICD-10-CM

## 2022-01-01 ENCOUNTER — Other Ambulatory Visit: Payer: Self-pay

## 2022-01-01 ENCOUNTER — Encounter (INDEPENDENT_AMBULATORY_CARE_PROVIDER_SITE_OTHER): Payer: Self-pay | Admitting: Family Medicine

## 2022-01-01 ENCOUNTER — Ambulatory Visit (INDEPENDENT_AMBULATORY_CARE_PROVIDER_SITE_OTHER): Payer: BC Managed Care – PPO | Admitting: Family Medicine

## 2022-01-01 VITALS — BP 142/80 | HR 83 | Temp 98.1°F | Ht 63.0 in | Wt 335.0 lb

## 2022-01-01 DIAGNOSIS — Z1331 Encounter for screening for depression: Secondary | ICD-10-CM | POA: Diagnosis not present

## 2022-01-01 DIAGNOSIS — Z6841 Body Mass Index (BMI) 40.0 and over, adult: Secondary | ICD-10-CM

## 2022-01-01 DIAGNOSIS — R7303 Prediabetes: Secondary | ICD-10-CM | POA: Diagnosis not present

## 2022-01-01 DIAGNOSIS — E559 Vitamin D deficiency, unspecified: Secondary | ICD-10-CM

## 2022-01-01 DIAGNOSIS — E282 Polycystic ovarian syndrome: Secondary | ICD-10-CM | POA: Diagnosis not present

## 2022-01-01 DIAGNOSIS — I10 Essential (primary) hypertension: Secondary | ICD-10-CM

## 2022-01-01 DIAGNOSIS — R5383 Other fatigue: Secondary | ICD-10-CM | POA: Diagnosis not present

## 2022-01-01 DIAGNOSIS — G8929 Other chronic pain: Secondary | ICD-10-CM

## 2022-01-01 DIAGNOSIS — M2550 Pain in unspecified joint: Secondary | ICD-10-CM

## 2022-01-01 DIAGNOSIS — D649 Anemia, unspecified: Secondary | ICD-10-CM

## 2022-01-01 DIAGNOSIS — Z9189 Other specified personal risk factors, not elsewhere classified: Secondary | ICD-10-CM

## 2022-01-01 DIAGNOSIS — R0602 Shortness of breath: Secondary | ICD-10-CM

## 2022-01-02 LAB — CBC WITH DIFFERENTIAL/PLATELET
Basophils Absolute: 0.1 10*3/uL (ref 0.0–0.2)
Basos: 1 %
EOS (ABSOLUTE): 0.3 10*3/uL (ref 0.0–0.4)
Eos: 4 %
Hematocrit: 38.6 % (ref 34.0–46.6)
Hemoglobin: 12.6 g/dL (ref 11.1–15.9)
Immature Grans (Abs): 0 10*3/uL (ref 0.0–0.1)
Immature Granulocytes: 0 %
Lymphocytes Absolute: 2.4 10*3/uL (ref 0.7–3.1)
Lymphs: 30 %
MCH: 23.4 pg — ABNORMAL LOW (ref 26.6–33.0)
MCHC: 32.6 g/dL (ref 31.5–35.7)
MCV: 72 fL — ABNORMAL LOW (ref 79–97)
Monocytes Absolute: 0.6 10*3/uL (ref 0.1–0.9)
Monocytes: 7 %
Neutrophils Absolute: 4.7 10*3/uL (ref 1.4–7.0)
Neutrophils: 58 %
Platelets: 370 10*3/uL (ref 150–450)
RBC: 5.39 x10E6/uL — ABNORMAL HIGH (ref 3.77–5.28)
RDW: 13.5 % (ref 11.7–15.4)
WBC: 8.1 10*3/uL (ref 3.4–10.8)

## 2022-01-02 LAB — COMPREHENSIVE METABOLIC PANEL
ALT: 19 IU/L (ref 0–32)
AST: 15 IU/L (ref 0–40)
Albumin/Globulin Ratio: 1.2 (ref 1.2–2.2)
Albumin: 3.7 g/dL — ABNORMAL LOW (ref 3.8–4.8)
Alkaline Phosphatase: 120 IU/L (ref 44–121)
BUN/Creatinine Ratio: 12 (ref 9–23)
BUN: 7 mg/dL (ref 6–24)
Bilirubin Total: 0.2 mg/dL (ref 0.0–1.2)
CO2: 23 mmol/L (ref 20–29)
Calcium: 9.4 mg/dL (ref 8.7–10.2)
Chloride: 104 mmol/L (ref 96–106)
Creatinine, Ser: 0.58 mg/dL (ref 0.57–1.00)
Globulin, Total: 3.1 g/dL (ref 1.5–4.5)
Glucose: 93 mg/dL (ref 70–99)
Potassium: 4.3 mmol/L (ref 3.5–5.2)
Sodium: 140 mmol/L (ref 134–144)
Total Protein: 6.8 g/dL (ref 6.0–8.5)
eGFR: 113 mL/min/{1.73_m2} (ref >59)

## 2022-01-02 LAB — LIPID PANEL WITH LDL/HDL RATIO
Cholesterol, Total: 183 mg/dL (ref 100–199)
HDL: 50 mg/dL (ref >39)
LDL Chol Calc (NIH): 121 mg/dL — ABNORMAL HIGH (ref 0–99)
LDL/HDL Ratio: 2.4 ratio (ref 0.0–3.2)
Triglycerides: 66 mg/dL (ref 0–149)
VLDL Cholesterol Cal: 12 mg/dL (ref 5–40)

## 2022-01-02 LAB — T4, FREE: Free T4: 1 ng/dL (ref 0.82–1.77)

## 2022-01-02 LAB — VITAMIN B12: Vitamin B-12: 1430 pg/mL — ABNORMAL HIGH (ref 232–1245)

## 2022-01-02 LAB — TSH: TSH: 0.595 u[IU]/mL (ref 0.450–4.500)

## 2022-01-02 LAB — VITAMIN D 25 HYDROXY (VIT D DEFICIENCY, FRACTURES): Vit D, 25-Hydroxy: 37.9 ng/mL (ref 30.0–100.0)

## 2022-01-02 LAB — FOLATE: Folate: 13.5 ng/mL (ref >3.0)

## 2022-01-02 LAB — HEMOGLOBIN A1C
Est. average glucose Bld gHb Est-mCnc: 131 mg/dL
Hgb A1c MFr Bld: 6.2 % — ABNORMAL HIGH (ref 4.8–5.6)

## 2022-01-02 LAB — INSULIN, RANDOM: INSULIN: 18.3 u[IU]/mL (ref 2.6–24.9)
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
## 2022-01-15 ENCOUNTER — Ambulatory Visit (INDEPENDENT_AMBULATORY_CARE_PROVIDER_SITE_OTHER): Payer: BC Managed Care – PPO | Admitting: Family Medicine

## 2022-01-15 ENCOUNTER — Other Ambulatory Visit: Payer: Self-pay

## 2022-01-15 ENCOUNTER — Encounter (INDEPENDENT_AMBULATORY_CARE_PROVIDER_SITE_OTHER): Payer: Self-pay | Admitting: Family Medicine

## 2022-01-15 VITALS — BP 143/83 | HR 86 | Temp 98.2°F | Ht 63.0 in | Wt 336.0 lb

## 2022-01-15 DIAGNOSIS — R7303 Prediabetes: Secondary | ICD-10-CM | POA: Diagnosis not present

## 2022-01-15 DIAGNOSIS — E559 Vitamin D deficiency, unspecified: Secondary | ICD-10-CM

## 2022-01-15 DIAGNOSIS — D508 Other iron deficiency anemias: Secondary | ICD-10-CM

## 2022-01-15 DIAGNOSIS — I10 Essential (primary) hypertension: Secondary | ICD-10-CM | POA: Diagnosis not present

## 2022-01-15 DIAGNOSIS — Z9189 Other specified personal risk factors, not elsewhere classified: Secondary | ICD-10-CM

## 2022-01-15 DIAGNOSIS — Z6841 Body Mass Index (BMI) 40.0 and over, adult: Secondary | ICD-10-CM

## 2022-01-15 DIAGNOSIS — E7849 Other hyperlipidemia: Secondary | ICD-10-CM

## 2022-01-15 DIAGNOSIS — E538 Deficiency of other specified B group vitamins: Secondary | ICD-10-CM

## 2022-01-15 DIAGNOSIS — E669 Obesity, unspecified: Secondary | ICD-10-CM

## 2022-01-15 MED ORDER — VITAMIN D (ERGOCALCIFEROL) 1.25 MG (50000 UNIT) PO CAPS: 50000.0000 [IU] | ORAL_CAPSULE | ORAL | 0 refills | Status: DC

## 2022-01-15 NOTE — Patient Instructions (Signed)
The 10-year ASCVD risk score (Arnett DK, et al., 2019) is: 4.5%   Values used to calculate the score:     Age: 46 years     Sex: Female     Is Non-Hispanic African American: Yes     Diabetic: No     Tobacco smoker: No     Systolic Blood Pressure: 191 mmHg     Is BP treated: Yes     HDL Cholesterol: 50 mg/dL     Total Cholesterol: 183 mg/dL

## 2022-01-16 NOTE — Progress Notes (Signed)
Chief Complaint:   OBESITY April Mann is here to discuss her progress with her obesity treatment plan along with follow-up of her obesity related diagnoses. April Mann is on the Category 3 Plan with breakfast options and states she is following her eating plan approximately 75% of the time. April Mann states she is not currently exercising.  Today's visit was #: 2 Starting weight: 335 lbs Starting date: 01/01/2022 Today's weight: 336 lbs Today's date: 01/15/2022 Total lbs lost to date: 0 Total lbs lost since last in-office visit: +1  Interim History: April Mann is here today for her first follow-up office visit since starting the program with [place].  All blood work/ lab tests that were recently ordered by myself or an outside provider were reviewed with patient today per their request.   Extended time was spent counseling her on all new disease processes that were discovered or preexisting ones that are affected by BMI.  she understands that many of these abnormalities will need to monitored regularly along with the current treatment plan of prudent dietary changes, in which we are making each and every office visit, to improve these health parameters. Breakfast= 1 piece of [place] bacon, 2 eggs, shredded cheese (not measure), cauliflower thins; Lunch= rotisserie chicken, egg, carrots, bacon bits, on salad with [place] sugar free raspberry vinaigrette; Dinner= pre-made chicken patty (?oz), cauliflower thins, and mustard. Pt is not weighing proteins. She has really decreased the number of times per week she eats out by half of what it was prior (every day prior).  Subjective:   1. Prediabetes Worsening. Discussed labs with patient today. Pt's fasting insulin elevated and A1c is 6.2. Per pt, diagnosed many years ago. She has never been on meds for pre-diabetes and prefers not to be.  2. Essential hypertension Discussed labs with patient today. BP at home 135/86, 150/88. Medication:  None  3. Vitamin D deficiency Discussed labs with patient today. She is currently taking  a multivitamin daily . She denies nausea, vomiting or muscle weakness nut does report fatigue.   4. Other iron deficiency anemia Discussed labs with patient today. Pt was on ferrous fumarate (Integra) in the past but has been on nothing for a long time. She is asymptomatic and has no concerns, but reports she is "always tired and sluggish".   5. Other hyperlipidemia Discussed labs with patient today. No meds in the past. Pt has elevated LDL. ASCVD risk with today's BP is 4.8% over the next 10 years.  6. B12 deficiency Discussed labs with patient today. Pt has been taking OTC B12 5K mcg daily, which she just started a week ago. Beforehand, she thinks she was taking 500 mcg plus a multivitamin. Pt feels it helps with tiredness and fatigue.  7. At risk for diabetes mellitus April Mann is at risk for diabetes mellitus due to weight, eating habits, and current labs.  Assessment/Plan:  No orders of the defined types were placed in this encounter.   There are no discontinued medications.   Meds ordered this encounter  Medications   Vitamin D, Ergocalciferol, (DRISDOL) 1.25 MG (50000 UNIT) CAPS capsule    Sig: Take 1 capsule (50,000 Units total) by mouth every 7 (seven) days.    Dispense:  4 capsule    Refill:  0    30 d supply;  ** OV for RF **   Do not send RF request     1. Prediabetes Extensive counseling done. A1c and FI are elevated. Handouts given on disease  process. Pt is to start meal plan and will add meds if needed.  2. Essential hypertension Disease counseling done. Goal is <130/80-85. Check BP at home every other day. If BP is not at goal at next OV, we will starr meds. Prudent nutritional plan and decrease salt intake to aid in weight loss and better control of BP.  3. Vitamin D deficiency Disease counseling done. Not at goal. Low Vitamin D level contributes to fatigue and are  associated with obesity, breast, and colon cancer. She agrees to continue to take prescription Vitamin D 50,000 IU every week and will follow-up for routine testing of Vitamin D, at least 2-3 times per year to avoid over-replacement.  Start- Vitamin D, Ergocalciferol, (DRISDOL) 1.25 MG (50000 UNIT) CAPS capsule; Take 1 capsule (50,000 Units total) by mouth every 7 (seven) days.  Dispense: 4 capsule; Refill: 0  4. Other iron deficiency anemia H/H are within normal limits at 12.6 and 38.6 but MCV and MCH are low. We will continue to monitor alongside eating healthy with more iron-rich foods.  5. Other hyperlipidemia Counseling done. Pt is very close to needing meds. Start prudent nutritional plan and decrease fast foods and high saturated and trans fat foods.  6. B12 deficiency Continue low dose B12 at 500 mcg plus multivitamin daily. B12 level is sufficient and even a little high.  7. At risk for diabetes mellitus - April Mann was given diabetes prevention education and counseling today of more than 28 minutes.  - Counseled patient on pathophysiology of disease and meaning/ implication of lab results.  - Reviewed how certain foods can either stimulate or inhibit insulin release, and subsequently affect hunger pathways  - Importance of following a healthy meal plan with limiting amounts of simple carbohydrates discussed with patient - Effects of regular aerobic exercise on blood sugar regulation reviewed and encouraged an eventual goal of 30 min 5d/week or more as a minimum.  - Briefly discussed treatment options, which always include dietary and lifestyle modification as first line.   - Handouts provided at patient's desire and/or told to go online to the American Diabetes Association website for further information.  8. Obesity with current BMI of 59.5  April Mann is currently in the action stage of change. As such, her goal is to continue with weight loss efforts. She has agreed to Change to  keeping a food journal and adhering to recommended goals of 1600-1700 calories and 110+ grams protein.   Exercise goals:  As is  Pt will start the meal plan and decrease eating out.  Handout: Eating Out Guide  Behavioral modification strategies: increasing lean protein intake, decreasing simple carbohydrates, increasing water intake, decreasing eating out, and planning for success.  April Mann has agreed to follow-up with our clinic in 2-3 weeks. She was informed of the importance of frequent follow-up visits to maximize her success with intensive lifestyle modifications for her multiple health conditions.   Objective:   Blood pressure (!) 143/83, pulse 86, temperature 98.2 F (36.8 C), height 5\' 3"  (1.6 m), weight (!) 336 lb (152.4 kg), SpO2 99 %. Body mass index is 59.52 kg/m.  General: Cooperative, alert, well developed, in no acute distress. HEENT: Conjunctivae and lids unremarkable. Cardiovascular: Regular rhythm.  Lungs: Normal work of breathing. Neurologic: No focal deficits.   Lab Results  Component Value Date   CREATININE 0.58 01/01/2022   BUN 7 01/01/2022   NA 140 01/01/2022   K 4.3 01/01/2022   CL 104 01/01/2022   CO2 23  01/01/2022   Lab Results  Component Value Date   ALT 19 01/01/2022   AST 15 01/01/2022   ALKPHOS 120 01/01/2022   BILITOT 0.2 01/01/2022   Lab Results  Component Value Date   HGBA1C 6.2 (H) 01/01/2022   HGBA1C 5.8 11/29/2019   Lab Results  Component Value Date   INSULIN 18.3 01/01/2022   Lab Results  Component Value Date   TSH 0.595 01/01/2022   Lab Results  Component Value Date   CHOL 183 01/01/2022   HDL 50 01/01/2022   LDLCALC 121 (H) 01/01/2022   TRIG 66 01/01/2022   CHOLHDL 4.0 04/05/2015   Lab Results  Component Value Date   VD25OH 37.9 01/01/2022   VD25OH 33 04/05/2015   VD25OH 46 03/24/2014   Lab Results  Component Value Date   WBC 8.1 01/01/2022   HGB 12.6 01/01/2022   HCT 38.6 01/01/2022   MCV 72 (L) 01/01/2022    PLT 370 01/01/2022    Attestation Statements:   Reviewed by clinician on day of visit: allergies, medications, problem list, medical history, surgical history, family history, social history, and previous encounter notes.  Coral Ceo, CMA, am acting as transcriptionist for [employer], DO.  I have reviewed the above documentation for accuracy and completeness, and I agree with the above. Marjory Sneddon, D.O.  The [place] was signed into law in 2016 which includes the topic of electronic health records.  This provides immediate access to information in MyChart.  This includes consultation notes, operative notes, office notes, lab results and pathology reports.  If you have any questions about what you read please let us know at your next visit so we can discuss your concerns and take corrective action if need be.  We are right here with you.

## 2022-01-31 ENCOUNTER — Encounter (INDEPENDENT_AMBULATORY_CARE_PROVIDER_SITE_OTHER): Payer: Self-pay | Admitting: Family Medicine

## 2022-01-31 ENCOUNTER — Other Ambulatory Visit: Payer: Self-pay

## 2022-01-31 ENCOUNTER — Ambulatory Visit (INDEPENDENT_AMBULATORY_CARE_PROVIDER_SITE_OTHER): Payer: BC Managed Care – PPO | Admitting: Family Medicine

## 2022-01-31 VITALS — BP 138/83 | HR 90 | Temp 97.9°F | Ht 63.0 in | Wt 332.0 lb

## 2022-01-31 DIAGNOSIS — I10 Essential (primary) hypertension: Secondary | ICD-10-CM | POA: Diagnosis not present

## 2022-01-31 DIAGNOSIS — E559 Vitamin D deficiency, unspecified: Secondary | ICD-10-CM

## 2022-01-31 DIAGNOSIS — E669 Obesity, unspecified: Secondary | ICD-10-CM

## 2022-01-31 DIAGNOSIS — Z9189 Other specified personal risk factors, not elsewhere classified: Secondary | ICD-10-CM | POA: Diagnosis not present

## 2022-01-31 DIAGNOSIS — Z6841 Body Mass Index (BMI) 40.0 and over, adult: Secondary | ICD-10-CM | POA: Diagnosis not present

## 2022-01-31 MED ORDER — VITAMIN D (ERGOCALCIFEROL) 1.25 MG (50000 UNIT) PO CAPS: 50000.0000 [IU] | ORAL_CAPSULE | ORAL | 0 refills | Status: DC

## 2022-02-04 NOTE — Progress Notes (Signed)
Chief Complaint:   OBESITY April Mann is here to discuss her progress with her obesity treatment plan along with follow-up of her obesity related diagnoses. April Mann is on keeping a food journal and adhering to recommended goals of 1600-1700 calories and 110+ grams of protein daily and states she is following her eating plan approximately 98% of the time. April Mann states she is walking for 15-30 minutes.  Today's visit was #: 3 Starting weight: 335 lbs Starting date: 01/01/2022 Today's weight: 332 lbs Today's date: 01/31/2022 Total lbs lost to date: 3 Total lbs lost since last in-office visit: 4  Interim History: April Mann focused more on protein this past couple of weeks. She started walking at work 2-3 days per week for 30 minutes total. She did journaling a few days in [employer], still cannot eat all of her protein.   Subjective:   1. Essential hypertension April Mann's at home blood pressures were in the 130-150's/upper 80's-90's. She is taking Diovan.   2. Vitamin D deficiency April Mann is currently taking prescription vitamin D 50,000 IU each week. She denies nausea, vomiting or muscle weakness.  3. At risk for malnutrition April Mann is at increased risk for malnutrition due to diet.  Assessment/Plan:  No orders of the defined types were placed in this encounter.   Medications Discontinued During This Encounter  Medication Reason   Vitamin D, Ergocalciferol, (DRISDOL) 1.25 MG (50000 UNIT) CAPS capsule Reorder     Meds ordered this encounter  Medications   Vitamin D, Ergocalciferol, (DRISDOL) 1.25 MG (50000 UNIT) CAPS capsule    Sig: Take 1 capsule (50,000 Units total) by mouth every 7 (seven) days.    Dispense:  4 capsule    Refill:  0    30 d supply;  ** OV for RF **   Do not send RF request     1. Essential hypertension April Mann is working on healthy weight loss and exercise to improve blood pressure control. We will watch for signs of hypotension as she continues her  lifestyle modifications.  2. Vitamin D deficiency We will refill prescription Vitamin D for 1 month. April Mann will follow-up for routine testing of Vitamin D, at least 2-3 times per year to avoid over-replacement.  - Vitamin D, Ergocalciferol, (DRISDOL) 1.25 MG (50000 UNIT) CAPS capsule; Take 1 capsule (50,000 Units total) by mouth every 7 (seven) days.  Dispense: 4 capsule; Refill: 0  3. At risk for malnutrition April Mann was given approximately 9 minutes of counseling today regarding prevention of malnutrition and ways to meet macronutrient goals..    4. Obesity with current BMI of 58.9 April Mann is currently in the action stage of change. As such, her goal is to continue with weight loss efforts. She has agreed to keeping a food journal and adhering to recommended goals of 1600-1700 calories and 110+ grams of protein daily.   April Mann is to bring in her journaling log to her next office visit.  Exercise goals: As is.  Behavioral modification strategies: increasing lean protein intake, meal planning and cooking strategies, keeping healthy foods in the home, and avoiding temptations.  April Mann has agreed to follow-up with our clinic in 2 weeks with [facility], FNP-C, and in 4 weeks with myself. She was informed of the importance of frequent follow-up visits to maximize her success with intensive lifestyle modifications for her multiple health conditions.   Objective:   Blood pressure 138/83, pulse 90, temperature 97.9 F (36.6 C), height 5\' 3"  (1.6 m), weight (!) 332 lb (  150.6 kg), SpO2 98 %. Body mass index is 58.81 kg/m.  General: Cooperative, alert, well developed, in no acute distress. HEENT: Conjunctivae and lids unremarkable. Cardiovascular: Regular rhythm.  Lungs: Normal work of breathing. Neurologic: No focal deficits.   Lab Results  Component Value Date   CREATININE 0.58 01/01/2022   BUN 7 01/01/2022   NA 140 01/01/2022   K 4.3 01/01/2022   CL 104 01/01/2022   CO2 23  01/01/2022   Lab Results  Component Value Date   ALT 19 01/01/2022   AST 15 01/01/2022   ALKPHOS 120 01/01/2022   BILITOT 0.2 01/01/2022   Lab Results  Component Value Date   HGBA1C 6.2 (H) 01/01/2022   HGBA1C 5.8 11/29/2019   Lab Results  Component Value Date   INSULIN 18.3 01/01/2022   Lab Results  Component Value Date   TSH 0.595 01/01/2022   Lab Results  Component Value Date   CHOL 183 01/01/2022   HDL 50 01/01/2022   LDLCALC 121 (H) 01/01/2022   TRIG 66 01/01/2022   CHOLHDL 4.0 04/05/2015   Lab Results  Component Value Date   VD25OH 37.9 01/01/2022   VD25OH 33 04/05/2015   VD25OH 46 03/24/2014   Lab Results  Component Value Date   WBC 8.1 01/01/2022   HGB 12.6 01/01/2022   HCT 38.6 01/01/2022   MCV 72 (L) 01/01/2022   PLT 370 01/01/2022   No results found for: IRON, TIBC, FERRITIN  Attestation Statements:   Reviewed by clinician on day of visit: allergies, medications, problem list, medical history, surgical history, family history, social history, and previous encounter notes.  Wilhemena Durie, am acting as transcriptionist for [employer], DO.  I have reviewed the above documentation for accuracy and completeness, and I agree with the above. Marjory Sneddon, D.O.  The [place] was signed into law in 2016 which includes the topic of electronic health records.  This provides immediate access to information in MyChart.  This includes consultation notes, operative notes, office notes, lab results and pathology reports.  If you have any questions about what you read please let us know at your next visit so we can discuss your concerns and take corrective action if need be.  We are right here with you.

## 2022-02-14 ENCOUNTER — Ambulatory Visit (INDEPENDENT_AMBULATORY_CARE_PROVIDER_SITE_OTHER): Payer: BC Managed Care – PPO | Admitting: Family Medicine

## 2022-02-28 ENCOUNTER — Encounter (INDEPENDENT_AMBULATORY_CARE_PROVIDER_SITE_OTHER): Payer: Self-pay | Admitting: Family Medicine

## 2022-02-28 ENCOUNTER — Other Ambulatory Visit: Payer: Self-pay

## 2022-02-28 ENCOUNTER — Ambulatory Visit (INDEPENDENT_AMBULATORY_CARE_PROVIDER_SITE_OTHER): Payer: BC Managed Care – PPO | Admitting: Family Medicine

## 2022-02-28 VITALS — BP 132/85 | HR 90 | Temp 98.4°F | Ht 63.0 in | Wt 331.0 lb

## 2022-02-28 DIAGNOSIS — Z9189 Other specified personal risk factors, not elsewhere classified: Secondary | ICD-10-CM

## 2022-02-28 DIAGNOSIS — R7303 Prediabetes: Secondary | ICD-10-CM

## 2022-02-28 DIAGNOSIS — E559 Vitamin D deficiency, unspecified: Secondary | ICD-10-CM | POA: Diagnosis not present

## 2022-02-28 DIAGNOSIS — E669 Obesity, unspecified: Secondary | ICD-10-CM

## 2022-02-28 DIAGNOSIS — Z6841 Body Mass Index (BMI) 40.0 and over, adult: Secondary | ICD-10-CM

## 2022-02-28 DIAGNOSIS — I10 Essential (primary) hypertension: Secondary | ICD-10-CM

## 2022-02-28 MED ORDER — VITAMIN D (ERGOCALCIFEROL) 1.25 MG (50000 UNIT) PO CAPS: 50000.0000 [IU] | ORAL_CAPSULE | ORAL | 0 refills | Status: DC

## 2022-02-28 MED ORDER — METFORMIN HCL 500 MG PO TABS: ORAL_TABLET | ORAL | 0 refills | Status: DC

## 2022-03-06 NOTE — Progress Notes (Signed)
? ? ? ?Chief Complaint:  ? ?OBESITY ?April Mann is here to discuss her progress with her obesity treatment plan along with follow-up of her obesity related diagnoses. April Mann is on keeping a food journal and adhering to recommended goals of 1600-1700 calories and 110+ grams of protein daily and states she is following her eating plan approximately 75% of the time. April Mann states she is walking for 30 minutes 5 times per week. ? ?Today's visit was #: 4 ?Starting weight: 335 lbs ?Starting date: 01/01/2022 ?Today's weight: 331 lbs ?Today's date: 02/28/2022 ?Total lbs lost to date: 4 ?Total lbs lost since last in-office visit: 1 ? ?Interim History: April Mann is here for a follow up office visit. We reviewed her meal plan and questions were answered. Patient's food recall appears to be accurate and consistent with what is on plan when she is following it. When eating on plan, her hunger and cravings are well controlled. April Mann forgot her journaling log today. ? ?Subjective:  ? ?1. Vitamin D deficiency ?April Mann is currently taking prescription vitamin D 50,000 IU each week. She denies nausea, vomiting or muscle weakness. ? ?2. Pre-diabetes ?April Mann notes increased sweets/carbohydrate cravings lately. Not able to stay on the plan like she wants to. She denies emotional eating as being an issue. ? ?3. Essential hypertension ?April Mann is on Diovan, and she is tolerating medication(s) well without side effects.  Medication compliance is good and patient appears to be taking it as prescribed. The patient denies additional concerns regarding this condition.  ? ?4. At risk for side effect of medication ?April Mann is at risk for drug side effects due to starting a new medications. ? ?Assessment/Plan:  ?No orders of the defined types were placed in this encounter. ? ? ?Medications Discontinued During This Encounter  ?Medication Reason  ? Vitamin D, Ergocalciferol, (DRISDOL) 1.25 MG (50000 UNIT) CAPS capsule Reorder  ?  ? ?Meds  ordered this encounter  ?Medications  ? Vitamin D, Ergocalciferol, (DRISDOL) 1.25 MG (50000 UNIT) CAPS capsule  ?  Sig: Take 1 capsule (50,000 Units total) by mouth every 7 (seven) days.  ?  Dispense:  4 capsule  ?  Refill:  0  ?  30 d supply;  ** OV for RF **   Do not send RF request  ? metFORMIN (GLUCOPHAGE) 500 MG tablet  ?  Sig: 1/2 tab with lunch and dinner  ?  Dispense:  30 tablet  ?  Refill:  0  ?  ? ?1. Vitamin D deficiency ?We will refill prescription Vitamin D for 1 month. April Mann will follow-up for routine testing of Vitamin D, at least 2-3 times per year to avoid over-replacement. ? ?- Vitamin D, Ergocalciferol, (DRISDOL) 1.25 MG (50000 UNIT) CAPS capsule; Take 1 capsule (50,000 Units total) by mouth every 7 (seven) days.  Dispense: 4 capsule; Refill: 0 ? ?2. Pre-diabetes ?April Mann agreed to start metformin 1/2 tablet BID after a discussion. She is to decrease simple carbohydrates and increase protein. ? ?- metFORMIN (GLUCOPHAGE) 500 MG tablet; 1/2 tab with lunch and dinner  Dispense: 30 tablet; Refill: 0 ? ?3. Essential hypertension ?April Mann is at goal essentially, and she is to continue her medications as directed.  ? ?Counseled [employer] on pathophysiology of disease and discussed treatment plan, which always includes dietary and lifestyle modification as first line.  ?Lifestyle changes such as following our low salt, heart healthy meal plan and engaging in a regular exercise program discussed  ?- Avoid buying foods that are: processed, frozen, or  prepackaged to avoid excess salt. ?- Ambulatory blood pressure monitoring encouraged.  Reminded patient that if they ever feel poorly in any way, to check their blood pressure and pulse as well. ?- We will continue to monitor closely alongside PCP/ specialists.  Pt reminded to also f/up with those individuals as instructed by them.  ?- We will continue to monitor symptoms as they relate to the her weight loss journey. ? ?4. At risk for side effect of  medication ?April Mann was given approximately 9 minutes of drug side effect counseling today.  We discussed side effect possibility and risk versus benefits. April Mann agreed to the medication and will contact this office if these side effects are intolerable. ? ?Repetitive spaced learning was employed today to elicit superior memory formation and behavioral change. ? ?5. Obesity with current BMI of 58.6 ?April Mann is currently in the action stage of change. As such, her goal is to continue with weight loss efforts. She has agreed to keeping a food journal and adhering to recommended goals of 1600-1700 calories and 110+ grams of protein daily.  ? ?April Mann is to bring in her log to her next office visit. ? ?Exercise goals: As is. ? ?Behavioral modification strategies: increasing lean protein intake, decreasing simple carbohydrates, and keeping a strict food journal. ? ?April Mann has agreed to follow-up with our clinic in 2 to 3 weeks. She was informed of the importance of frequent follow-up visits to maximize her success with intensive lifestyle modifications for her multiple health conditions.  ? ?Objective:  ? ?Blood pressure 132/85, pulse 90, temperature 98.4 ?F (36.9 ?C), height '5\' 3"'$  (1.6 m), weight (!) 331 lb (150.1 kg), SpO2 99 %. ?Body mass index is 58.63 kg/m?. ? ?General: Cooperative, alert, well developed, in no acute distress. ?HEENT: Conjunctivae and lids unremarkable. ?Cardiovascular: Regular rhythm.  ?Lungs: Normal work of breathing. ?Neurologic: No focal deficits.  ? ?Lab Results  ?Component Value Date  ? CREATININE 0.58 01/01/2022  ? BUN 7 01/01/2022  ? NA 140 01/01/2022  ? K 4.3 01/01/2022  ? CL 104 01/01/2022  ? CO2 23 01/01/2022  ? ?Lab Results  ?Component Value Date  ? ALT 19 01/01/2022  ? AST 15 01/01/2022  ? ALKPHOS 120 01/01/2022  ? BILITOT 0.2 01/01/2022  ? ?Lab Results  ?Component Value Date  ? HGBA1C 6.2 (H) 01/01/2022  ? HGBA1C 5.8 11/29/2019  ? ?Lab Results  ?Component Value Date  ? INSULIN 18.3  01/01/2022  ? ?Lab Results  ?Component Value Date  ? TSH 0.595 01/01/2022  ? ?Lab Results  ?Component Value Date  ? CHOL 183 01/01/2022  ? HDL 50 01/01/2022  ? LDLCALC 121 (H) 01/01/2022  ? TRIG 66 01/01/2022  ? CHOLHDL 4.0 04/05/2015  ? ?Lab Results  ?Component Value Date  ? VD25OH 37.9 01/01/2022  ? VD25OH 33 04/05/2015  ? VD25OH 46 03/24/2014  ? ?Lab Results  ?Component Value Date  ? WBC 8.1 01/01/2022  ? HGB 12.6 01/01/2022  ? HCT 38.6 01/01/2022  ? MCV 72 (L) 01/01/2022  ? PLT 370 01/01/2022  ? ?No results found for: IRON, TIBC, FERRITIN ? ?Attestation Statements:  ? ?Reviewed by clinician on day of visit: allergies, medications, problem list, medical history, surgical history, family history, social history, and previous encounter notes. ? ? ?I, Trixie Dredge, am acting as transcriptionist for [employer], DO. ? ?I have reviewed the above documentation for accuracy and completeness, and I agree with the above. Marjory Sneddon, D.O. ? ?The 21st  Cent[employer] Cures Act was signed into law in 2016 which includes the topic of electronic health records.  This provides immediate access to information in MyChart.  This includes consultation notes, operative notes, office notes, lab results and pathology reports.  If you have any questions about what you read please let us know at your next visit so we can discuss your concerns and take corrective action if need be.  We are right here with you. ? ? ?

## 2022-03-25 ENCOUNTER — Encounter (INDEPENDENT_AMBULATORY_CARE_PROVIDER_SITE_OTHER): Payer: Self-pay | Admitting: Family Medicine

## 2022-03-25 ENCOUNTER — Other Ambulatory Visit: Payer: Self-pay

## 2022-03-25 ENCOUNTER — Ambulatory Visit (INDEPENDENT_AMBULATORY_CARE_PROVIDER_SITE_OTHER): Payer: BC Managed Care – PPO | Admitting: Family Medicine

## 2022-03-25 VITALS — BP 117/68 | HR 106 | Temp 98.1°F | Ht 63.0 in | Wt 330.0 lb

## 2022-03-25 DIAGNOSIS — E559 Vitamin D deficiency, unspecified: Secondary | ICD-10-CM

## 2022-03-25 DIAGNOSIS — E669 Obesity, unspecified: Secondary | ICD-10-CM | POA: Diagnosis not present

## 2022-03-25 DIAGNOSIS — Z6841 Body Mass Index (BMI) 40.0 and over, adult: Secondary | ICD-10-CM | POA: Diagnosis not present

## 2022-03-25 DIAGNOSIS — R7303 Prediabetes: Secondary | ICD-10-CM

## 2022-03-25 DIAGNOSIS — Z9189 Other specified personal risk factors, not elsewhere classified: Secondary | ICD-10-CM

## 2022-03-25 MED ORDER — METFORMIN HCL 500 MG PO TABS: ORAL_TABLET | ORAL | 0 refills | Status: DC

## 2022-03-25 MED ORDER — VITAMIN D (ERGOCALCIFEROL) 1.25 MG (50000 UNIT) PO CAPS: 50000.0000 [IU] | ORAL_CAPSULE | ORAL | 0 refills | Status: DC

## 2022-04-02 NOTE — Progress Notes (Signed)
? ? ? ?Chief Complaint:  ? ?OBESITY ?April Mann is here to discuss her progress with her obesity treatment plan along with follow-up of her obesity related diagnoses. April Mann is on keeping a food journal and adhering to recommended goals of 1600-1700 calories and 100 grams of protein and states she is following her eating plan approximately 50% of the time. April Mann states she is walking for 30 minutes 5 times per week. ? ?Today's visit was #: 5 ?Starting weight: 335 lbs ?Starting date: 01/01/2022 ?Today's weight: 330 lbs ?Today's date: 03/25/2022 ?Total lbs lost to date: 5 lbs ?Total lbs lost since last in-office visit: 1 lb ? ?Interim History: "Even when I eat the right thinks, I am never hungry, and I skip dinner."  She is not journaling, which really helped her be aware in the past. ? ?Subjective:  ? ?1. Pre-diabetes ?Tolerating 1/21 tablet of metformin.  Ok now, at first had nausea with it.  Started last office visit. ? ?2. Vitamin D deficiency ?April Mann is tolerating medication(s) well without side effects.  Medication compliance is good and patient appears to be taking it as prescribed.  Denies additional concerns regarding this condition.  ? ?3. At risk for side effect of medication ?April Mann is at risk for side effect of medication due to increasing her dose of metformin today. ? ?Assessment/Plan:  ? ?Orders Placed This Encounter  ?Procedures  ? Hemoglobin A1c  ? VITAMIN D 25 Hydroxy (Vit-D Deficiency, Fractures)  ? ? ?Medications Discontinued During This Encounter  ?Medication Reason  ? Vitamin D, Ergocalciferol, (DRISDOL) 1.25 MG (50000 UNIT) CAPS capsule Reorder  ? metFORMIN (GLUCOPHAGE) 500 MG tablet Reorder  ?  ? ?Meds ordered this encounter  ?Medications  ? Vitamin D, Ergocalciferol, (DRISDOL) 1.25 MG (50000 UNIT) CAPS capsule  ?  Sig: Take 1 capsule (50,000 Units total) by mouth every 7 (seven) days.  ?  Dispense:  4 capsule  ?  Refill:  0  ?  30 d supply;  ** OV for RF **   Do not send RF request   ? metFORMIN (GLUCOPHAGE) 500 MG tablet  ?  Sig: 1 tab with lunch and 1 tab with dinner  ?  Dispense:  60 tablet  ?  Refill:  0  ?  ? ?1. Pre-diabetes ?Increase to 1 tablet BID and refill metformin.  Check A1c and proteins and carbs and hit goals with journaling. ? ?- Increase metFORMIN (GLUCOPHAGE) 500 MG tablet; 1 tab with lunch and 1 tab with dinner  Dispense: 60 tablet; Refill: 0 ?- Hemoglobin A1c at next office visit. ? ?2. Vitamin D deficiency ?Refill ergocalciferol for 30 days. ?Check vitamin D level at next office visit. ? ?- Refill Vitamin D, Ergocalciferol, (DRISDOL) 1.25 MG (50000 UNIT) CAPS capsule; Take 1 capsule (50,000 Units total) by mouth every 7 (seven) days.  Dispense: 4 capsule; Refill: 0 ?- VITAMIN D 25 Hydroxy (Vit-D Deficiency, Fractures) ? ?3. At risk for side effect of medication ?April Mann was given approximately 10 minutes of drug side effect counseling today.  We discussed side effect possibility and risk versus benefits. April Mann agreed to the medication and will contact this office if these side effects are intolerable. ? ?Repetitive spaced learning was employed today to elicit superior memory formation and behavioral change.  ? ?4. Obesity with current BMI of 58.5 ? ?April Mann is currently in the action stage of change. As such, her goal is to continue with weight loss efforts. She has agreed to keeping a food journal  and adhering to recommended goals of 1700 calories and 110+ grams of protein.  ? ?She will bring in her food journal - eat more and hit goals. ? ?Will check labs at next office visit. ? ?Exercise goals:  As is. ? ?Behavioral modification strategies: increasing lean protein intake, decreasing simple carbohydrates, no skipping meals, and keeping a strict food journal. ? ?April Mann has agreed to follow-up with our clinic in 3 weeks. She was informed of the importance of frequent follow-up visits to maximize her success with intensive lifestyle modifications for her multiple health  conditions.  ? ?Objective:  ? ?Blood pressure 117/68, pulse (!) 106, temperature 98.1 ?F (36.7 ?C), height '5\' 3"'$  (1.6 m), weight (!) 330 lb (149.7 kg), SpO2 96 %. ?Body mass index is 58.46 kg/m?. ? ?General: Cooperative, alert, well developed, in no acute distress. ?HEENT: Conjunctivae and lids unremarkable. ?Cardiovascular: Regular rhythm.  ?Lungs: Normal work of breathing. ?Neurologic: No focal deficits.  ? ?Lab Results  ?Component Value Date  ? CREATININE 0.58 01/01/2022  ? BUN 7 01/01/2022  ? NA 140 01/01/2022  ? K 4.3 01/01/2022  ? CL 104 01/01/2022  ? CO2 23 01/01/2022  ? ?Lab Results  ?Component Value Date  ? ALT 19 01/01/2022  ? AST 15 01/01/2022  ? ALKPHOS 120 01/01/2022  ? BILITOT 0.2 01/01/2022  ? ?Lab Results  ?Component Value Date  ? HGBA1C 6.2 (H) 01/01/2022  ? HGBA1C 5.8 11/29/2019  ? ?Lab Results  ?Component Value Date  ? INSULIN 18.3 01/01/2022  ? ?Lab Results  ?Component Value Date  ? TSH 0.595 01/01/2022  ? ?Lab Results  ?Component Value Date  ? CHOL 183 01/01/2022  ? HDL 50 01/01/2022  ? LDLCALC 121 (H) 01/01/2022  ? TRIG 66 01/01/2022  ? CHOLHDL 4.0 04/05/2015  ? ?Lab Results  ?Component Value Date  ? VD25OH 37.9 01/01/2022  ? VD25OH 33 04/05/2015  ? VD25OH 46 03/24/2014  ? ?Lab Results  ?Component Value Date  ? WBC 8.1 01/01/2022  ? HGB 12.6 01/01/2022  ? HCT 38.6 01/01/2022  ? MCV 72 (L) 01/01/2022  ? PLT 370 01/01/2022  ? ?Attestation Statements:  ? ?Reviewed by clinician on day of visit: allergies, medications, problem list, medical history, surgical history, family history, social history, and previous encounter notes. ? ?I, Water quality scientist, CMA, am acting as transcriptionist for [employer], DO. ? ?I have reviewed the above documentation for accuracy and completeness, and I agree with the above. Marjory Sneddon, D.O. ? ?The [place] was signed into law in 2016 which includes the topic of electronic health records.  This provides immediate access to information in  MyChart.  This includes consultation notes, operative notes, office notes, lab results and pathology reports.  If you have any questions about what you read please let us know at your next visit so we can discuss your concerns and take corrective action if need be.  We are right here with you. ? ?

## 2022-04-18 ENCOUNTER — Ambulatory Visit (INDEPENDENT_AMBULATORY_CARE_PROVIDER_SITE_OTHER): Payer: BC Managed Care – PPO | Admitting: Family Medicine

## 2022-04-18 ENCOUNTER — Encounter (INDEPENDENT_AMBULATORY_CARE_PROVIDER_SITE_OTHER): Payer: Self-pay | Admitting: Family Medicine

## 2022-04-18 VITALS — BP 136/79 | HR 96 | Temp 98.2°F | Ht 63.0 in | Wt 332.0 lb

## 2022-04-18 DIAGNOSIS — E669 Obesity, unspecified: Secondary | ICD-10-CM | POA: Diagnosis not present

## 2022-04-18 DIAGNOSIS — E559 Vitamin D deficiency, unspecified: Secondary | ICD-10-CM | POA: Diagnosis not present

## 2022-04-18 DIAGNOSIS — Z9189 Other specified personal risk factors, not elsewhere classified: Secondary | ICD-10-CM

## 2022-04-18 DIAGNOSIS — Z6841 Body Mass Index (BMI) 40.0 and over, adult: Secondary | ICD-10-CM

## 2022-04-18 DIAGNOSIS — R7303 Prediabetes: Secondary | ICD-10-CM | POA: Diagnosis not present

## 2022-04-18 MED ORDER — VITAMIN D (ERGOCALCIFEROL) 1.25 MG (50000 UNIT) PO CAPS: 50000.0000 [IU] | ORAL_CAPSULE | ORAL | 0 refills | Status: DC

## 2022-04-18 MED ORDER — METFORMIN HCL 500 MG PO TABS: ORAL_TABLET | ORAL | 0 refills | Status: DC

## 2022-05-06 NOTE — Progress Notes (Signed)
? ? ? ?Chief Complaint:  ? ?OBESITY ?April Mann is here to discuss her progress with her obesity treatment plan along with follow-up of her obesity related diagnoses. April Mann is on keeping a food journal and adhering to recommended goals of 1700 calories and 110+ grams of protein daily and states she is following her eating plan approximately 75-80% of the time. April Mann states she is walking for 45 minutes 5 times per week. ? ?Today's visit was #: 6 ?Starting weight: 335 lbs ?Starting date: 01/01/2022 ?Today's weight: 332 lbs ?Today's date: 04/18/2022 ?Total lbs lost to date: 3 ?Total lbs lost since last in-office visit: 0 ? ?Interim History: April Mann felt that by her journaling, she can see what she needs to correct. Some days under in calories and other days over. Same with the protein intake.  ? ?Subjective:  ? ?1. Pre-diabetes ?April Mann has a diagnosis of prediabetes based on her elevated HgA1c and was informed this puts her at greater risk of developing diabetes. She continues to work on diet and exercise to decrease her risk of diabetes. She denies nausea or hypoglycemia. ? ?2. Vitamin D deficiency ?She is currently taking prescription vitamin D 50,000 IU each week. She denies nausea, vomiting or muscle weakness. ? ?3. At risk for deficient intake of food ?The patient is at a higher than average risk of deficient intake of food due to under and calories and protein. ? ?Assessment/Plan:  ?No orders of the defined types were placed in this encounter. ? ? ?Medications Discontinued During This Encounter  ?Medication Reason  ? Vitamin D, Ergocalciferol, (DRISDOL) 1.25 MG (50000 UNIT) CAPS capsule Reorder  ? metFORMIN (GLUCOPHAGE) 500 MG tablet Reorder  ?  ? ?Meds ordered this encounter  ?Medications  ? Vitamin D, Ergocalciferol, (DRISDOL) 1.25 MG (50000 UNIT) CAPS capsule  ?  Sig: Take 1 capsule (50,000 Units total) by mouth every 7 (seven) days.  ?  Dispense:  4 capsule  ?  Refill:  0  ?  30 d supply;  ** OV for RF **    Do not send RF request  ? metFORMIN (GLUCOPHAGE) 500 MG tablet  ?  Sig: 1 tab with lunch and 1 tab with dinner  ?  Dispense:  60 tablet  ?  Refill:  0  ?  30 d supply;  ** OV for RF **   Do not send RF request  ?  ? ?1. Pre-diabetes ?We will refill metformin 500 mg BID for 1 month. Ellis will continue to work on weight loss, exercise, and decreasing simple carbohydrates to help decrease the risk of diabetes.  ? ?- metFORMIN (GLUCOPHAGE) 500 MG tablet; 1 tab with lunch and 1 tab with dinner  Dispense: 60 tablet; Refill: 0 ? ?2. Vitamin D deficiency ?Low Vitamin D level contributes to fatigue and are associated with obesity, breast, and colon cancer. We will refill prescription Vitamin D 50,000 IU every week for 1 month. April Mann will follow-up for routine testing of Vitamin D, at least 2-3 times per year to avoid over-replacement. ? ?- Vitamin D, Ergocalciferol, (DRISDOL) 1.25 MG (50000 UNIT) CAPS capsule; Take 1 capsule (50,000 Units total) by mouth every 7 (seven) days.  Dispense: 4 capsule; Refill: 0 ? ?3. At risk for deficient intake of food ?April Mann was given extensive education and counseling today of more than 9 minutes on risks associated with deficient food intake.  Counseled her on the importance of following our prescribed meal plan and eating adequate amounts of protein.  Discussed with  April Mann that inadequate food intake over longer periods of time can slow their metabolism down significantly.  ?  ?4. Obesity with current BMI of 58.8 ?April Mann is currently in the action stage of change. As such, her goal is to continue with weight loss efforts. She has agreed to keeping a food journal and adhering to recommended goals of 1400 calories and 110+ grams of protein daily.  ? ?Exercise goals: As is. ? ?Behavioral modification strategies: meal planning and cooking strategies. ? ?April Mann has agreed to follow-up with our clinic in 3 weeks. She was informed of the importance of frequent follow-up visits  to maximize her success with intensive lifestyle modifications for her multiple health conditions.  ? ?Objective:  ? ?Blood pressure 136/79, pulse 96, temperature 98.2 ?F (36.8 ?C), height '5\' 3"'$  (1.6 m), weight (!) 332 lb (150.6 kg), SpO2 100 %. ?Body mass index is 58.81 kg/m?. ? ?General: Cooperative, alert, well developed, in no acute distress. ?HEENT: Conjunctivae and lids unremarkable. ?Cardiovascular: Regular rhythm.  ?Lungs: Normal work of breathing. ?Neurologic: No focal deficits.  ? ?Lab Results  ?Component Value Date  ? CREATININE 0.58 01/01/2022  ? BUN 7 01/01/2022  ? NA 140 01/01/2022  ? K 4.3 01/01/2022  ? CL 104 01/01/2022  ? CO2 23 01/01/2022  ? ?Lab Results  ?Component Value Date  ? ALT 19 01/01/2022  ? AST 15 01/01/2022  ? ALKPHOS 120 01/01/2022  ? BILITOT 0.2 01/01/2022  ? ?Lab Results  ?Component Value Date  ? HGBA1C 6.2 (H) 01/01/2022  ? HGBA1C 5.8 11/29/2019  ? ?Lab Results  ?Component Value Date  ? INSULIN 18.3 01/01/2022  ? ?Lab Results  ?Component Value Date  ? TSH 0.595 01/01/2022  ? ?Lab Results  ?Component Value Date  ? CHOL 183 01/01/2022  ? HDL 50 01/01/2022  ? LDLCALC 121 (H) 01/01/2022  ? TRIG 66 01/01/2022  ? CHOLHDL 4.0 04/05/2015  ? ?Lab Results  ?Component Value Date  ? VD25OH 37.9 01/01/2022  ? VD25OH 33 04/05/2015  ? VD25OH 46 03/24/2014  ? ?Lab Results  ?Component Value Date  ? WBC 8.1 01/01/2022  ? HGB 12.6 01/01/2022  ? HCT 38.6 01/01/2022  ? MCV 72 (L) 01/01/2022  ? PLT 370 01/01/2022  ? ?No results found for: IRON, TIBC, FERRITIN ? ?Attestation Statements:  ? ?Reviewed by clinician on day of visit: allergies, medications, problem list, medical history, surgical history, family history, social history, and previous encounter notes. ? ? ?I, Trixie Dredge, am acting as transcriptionist for [employer], DO. ? ?I have reviewed the above documentation for accuracy and completeness, and I agree with the above. Marjory Sneddon, D.O. ? ?The [place] was signed  into law in 2016 which includes the topic of electronic health records.  This provides immediate access to information in MyChart.  This includes consultation notes, operative notes, office notes, lab results and pathology reports.  If you have any questions about what you read please let us know at your next visit so we can discuss your concerns and take corrective action if need be.  We are right here with you. ? ? ?

## 2022-05-14 ENCOUNTER — Ambulatory Visit (INDEPENDENT_AMBULATORY_CARE_PROVIDER_SITE_OTHER): Payer: BC Managed Care – PPO | Admitting: Family Medicine

## 2022-06-04 ENCOUNTER — Other Ambulatory Visit: Payer: Self-pay | Admitting: Obstetrics and Gynecology

## 2022-06-04 DIAGNOSIS — Z1231 Encounter for screening mammogram for malignant neoplasm of breast: Secondary | ICD-10-CM

## 2022-06-07 ENCOUNTER — Ambulatory Visit
Admission: RE | Admit: 2022-06-07 | Discharge: 2022-06-07 | Disposition: A | Discharge status: Home / Self Care | Payer: BC Managed Care – PPO | Source: Ambulatory Visit | Attending: Obstetrics and Gynecology | Admitting: Obstetrics and Gynecology

## 2022-06-07 DIAGNOSIS — Z1231 Encounter for screening mammogram for malignant neoplasm of breast: Secondary | ICD-10-CM

## 2022-06-07 LAB — HM MAMMOGRAPHY: HM Mammogram: NORMAL (ref 0–4)

## 2022-06-24 ENCOUNTER — Ambulatory Visit (INDEPENDENT_AMBULATORY_CARE_PROVIDER_SITE_OTHER): Payer: BC Managed Care – PPO | Admitting: Family Medicine

## 2022-06-24 ENCOUNTER — Encounter (INDEPENDENT_AMBULATORY_CARE_PROVIDER_SITE_OTHER): Payer: Self-pay | Admitting: Family Medicine

## 2022-06-24 VITALS — BP 139/82 | HR 97 | Temp 97.7°F | Ht 63.0 in | Wt 333.0 lb

## 2022-06-24 DIAGNOSIS — Z9189 Other specified personal risk factors, not elsewhere classified: Secondary | ICD-10-CM

## 2022-06-24 DIAGNOSIS — E669 Obesity, unspecified: Secondary | ICD-10-CM | POA: Diagnosis not present

## 2022-06-24 DIAGNOSIS — R7303 Prediabetes: Secondary | ICD-10-CM

## 2022-06-24 DIAGNOSIS — E559 Vitamin D deficiency, unspecified: Secondary | ICD-10-CM

## 2022-06-24 DIAGNOSIS — Z6841 Body Mass Index (BMI) 40.0 and over, adult: Secondary | ICD-10-CM | POA: Diagnosis not present

## 2022-06-24 MED ORDER — VITAMIN D (ERGOCALCIFEROL) 1.25 MG (50000 UNIT) PO CAPS: 50000.0000 [IU] | ORAL_CAPSULE | ORAL | 0 refills | Status: DC

## 2022-06-24 MED ORDER — METFORMIN HCL 500 MG PO TABS: ORAL_TABLET | ORAL | 0 refills | Status: DC

## 2022-06-25 LAB — VITAMIN D 25 HYDROXY (VIT D DEFICIENCY, FRACTURES): Vit D, 25-Hydroxy: 38.5 ng/mL (ref 30.0–100.0)

## 2022-06-25 LAB — HEMOGLOBIN A1C
Est. average glucose Bld gHb Est-mCnc: 131 mg/dL
Hgb A1c MFr Bld: 6.2 % — ABNORMAL HIGH (ref 4.8–5.6)

## 2022-06-27 NOTE — Progress Notes (Signed)
Chief Complaint:   OBESITY April Mann is here to discuss her progress with her obesity treatment plan along with follow-up of her obesity related diagnoses. April Mann is on keeping a food journal and adhering to recommended goals of 1400 calories and 110+ grams protein and states she is following her eating plan approximately 0% of the time. April Mann states she is walking 2 miles 2 times per week.  Today's visit was #: 7 Starting weight: 335 lbs Starting date: 01/01/2022 Today's weight: 333 lbs Today's date: 06/24/2022 Total lbs lost to date: 2 Total lbs lost since last in-office visit: +1  Interim History: April Mann restarted the meal plan a week ago, but still skips meals. She had 2 chicken legs, some cabbage, and kale today. Pt denies hunger, as she doesn't feel like eating.  Subjective:   1. Pre-diabetes April Mann stopped the meal plan and meds 04/18/2022, but recently restarted a week ago. She denies sweets cravings and has no hunger on the medication. Her last A1c was 6.2 about 6 months ago.  2. Vitamin D deficiency Pt's Vit D level was 37 about 6 months ago. She is out of Vit D and needs a refill. Pt reports she has been tired/fatigued lately.  3. At risk for depression April Mann is at risk for depression due to stressors lately.  Assessment/Plan:   Orders Placed This Encounter  Procedures   VITAMIN D 25 Hydroxy (Vit-D Deficiency, Fractures)   Hemoglobin A1c    Medications Discontinued During This Encounter  Medication Reason   Vitamin D, Ergocalciferol, (DRISDOL) 1.25 MG (50000 UNIT) CAPS capsule Reorder   metFORMIN (GLUCOPHAGE) 500 MG tablet Reorder     Meds ordered this encounter  Medications   metFORMIN (GLUCOPHAGE) 500 MG tablet    Sig: 1 tab with lunch and 1 tab with dinner    Dispense:  60 tablet    Refill:  0    30 d supply;  ** OV for RF **   Do not send RF request   Vitamin D, Ergocalciferol, (DRISDOL) 1.25 MG (50000 UNIT) CAPS capsule    Sig: Take 1 capsule  (50,000 Units total) by mouth every 7 (seven) days.    Dispense:  4 capsule    Refill:  0    30 d supply;  ** OV for RF **   Do not send RF request     1. Pre-diabetes April Mann will continue to work on weight loss, exercise, and decreasing simple carbohydrates to help decrease the risk of diabetes. Check labs today.  Refill- metFORMIN (GLUCOPHAGE) 500 MG tablet; 1 tab with lunch and 1 tab with dinner  Dispense: 60 tablet; Refill: 0  - Hemoglobin A1c  2. Vitamin D deficiency Low Vitamin D level contributes to fatigue and are associated with obesity, breast, and colon cancer. She agrees to restart prescription Vitamin D '@50'$ ,000 IU every week and will follow-up for routine testing of Vitamin D, at least 2-3 times per year to avoid over-replacement. Check labs today.  Refill- Vitamin D, Ergocalciferol, (DRISDOL) 1.25 MG (50000 UNIT) CAPS capsule; Take 1 capsule (50,000 Units total) by mouth every 7 (seven) days.  Dispense: 4 capsule; Refill: 0  - VITAMIN D 25 Hydroxy (Vit-D Deficiency, Fractures)  3. At risk for depression April Mann was given approximately 9 minutes of depression prevention counseling today due to their higher than average risk for this condition.The patient has several risk factors for depression such as chronic medical conditions, sleep issues, major life stressors/events, etc., and we  discussed these today. April Mann was also counseled on the importance of a healthy work-life balance, a healthy relationship with food, and a good support system. We discussed various strategies to help cope with these emotions as well. I recommended counseling, meditation or prayer, healthy eating habits, sleep hygiene, and exercising to help manage these feelings.   4. Obesity with current BMI of 45 April Mann is currently in the action stage of change. As such, her goal is to continue with weight loss efforts. She has agreed to the Category 2 Plan or keeping a food journal and  adhering to recommended goals of 1400 calories and 110 grams protein.   Recipes given- Packet I. Pt will walk 5-10 minutes per day on her own for stress management.  Exercise goals: All adults should avoid inactivity. Some physical activity is better than none, and adults who participate in any amount of physical activity gain some health benefits.  Behavioral modification strategies: increasing lean protein intake, no skipping meals, and meal planning and cooking strategies.  April Mann has agreed to follow-up with our clinic in 3 weeks. She was informed of the importance of frequent follow-up visits to maximize her success with intensive lifestyle modifications for her multiple health conditions.   April Mann was informed we would discuss her lab results at her next visit unless there is a critical issue that needs to be addressed sooner. April Mann agreed to keep her next visit at the agreed upon time to discuss these results.  Objective:   Blood pressure 139/82, pulse 97, temperature 97.7 F (36.5 C), height '5\' 3"'$  (1.6 m), weight (!) 333 lb (151 kg), last menstrual period 03/30/2022, SpO2 (!) 89 %. Body mass index is 58.99 kg/m.  General: Cooperative, alert, well developed, in no acute distress. HEENT: Conjunctivae and lids unremarkable. Cardiovascular: Regular rhythm.  Lungs: Normal work of breathing. Neurologic: No focal deficits.   Lab Results  Component Value Date   CREATININE 0.58 01/01/2022   BUN 7 01/01/2022   NA 140 01/01/2022   K 4.3 01/01/2022   CL 104 01/01/2022   CO2 23 01/01/2022   Lab Results  Component Value Date   ALT 19 01/01/2022   AST 15 01/01/2022   ALKPHOS 120 01/01/2022   BILITOT 0.2 01/01/2022   Lab Results  Component Value Date   HGBA1C 6.2 (H) 06/24/2022   HGBA1C 6.2 (H) 01/01/2022   HGBA1C 5.8 11/29/2019   Lab Results  Component Value Date   INSULIN 18.3 01/01/2022   Lab Results  Component Value Date   TSH 0.595 01/01/2022   Lab Results   Component Value Date   CHOL 183 01/01/2022   HDL 50 01/01/2022   LDLCALC 121 (H) 01/01/2022   TRIG 66 01/01/2022   CHOLHDL 4.0 04/05/2015   Lab Results  Component Value Date   VD25OH 38.5 06/24/2022   VD25OH 37.9 01/01/2022   VD25OH 33 04/05/2015   Lab Results  Component Value Date   WBC 8.1 01/01/2022   HGB 12.6 01/01/2022   HCT 38.6 01/01/2022   MCV 72 (L) 01/01/2022   PLT 370 01/01/2022    Attestation Statements:   Reviewed by clinician on day of visit: allergies, medications, problem list, medical history, surgical history, family history, social history, and previous encounter notes.  I, Kathlene November, BS, CMA, am acting as transcriptionist for [employer], DO.   I have reviewed the above documentation for accuracy and completeness, and I agree with the above. Marjory Sneddon, D.O.  The 21st Century  Cures Act was signed into law in 2016 which includes the topic of electronic health records.  This provides immediate access to information in MyChart.  This includes consultation notes, operative notes, office notes, lab results and pathology reports.  If you have any questions about what you read please let us know at your next visit so we can discuss your concerns and take corrective action if need be.  We are right here with you.

## 2022-07-25 ENCOUNTER — Ambulatory Visit (INDEPENDENT_AMBULATORY_CARE_PROVIDER_SITE_OTHER): Payer: BC Managed Care – PPO | Admitting: Family Medicine

## 2022-08-07 ENCOUNTER — Encounter (INDEPENDENT_AMBULATORY_CARE_PROVIDER_SITE_OTHER): Payer: Self-pay

## 2022-10-28 ENCOUNTER — Ambulatory Visit: Payer: BC Managed Care – PPO | Admitting: Internal Medicine

## 2022-10-28 ENCOUNTER — Encounter: Payer: Self-pay | Admitting: Internal Medicine

## 2022-10-28 VITALS — BP 146/86 | HR 91 | Temp 98.4°F | Ht 62.0 in | Wt 333.8 lb

## 2022-10-28 DIAGNOSIS — Z862 Personal history of diseases of the blood and blood-forming organs and certain disorders involving the immune mechanism: Secondary | ICD-10-CM | POA: Diagnosis not present

## 2022-10-28 DIAGNOSIS — Z1159 Encounter for screening for other viral diseases: Secondary | ICD-10-CM

## 2022-10-28 DIAGNOSIS — R7303 Prediabetes: Secondary | ICD-10-CM

## 2022-10-28 DIAGNOSIS — I10 Essential (primary) hypertension: Secondary | ICD-10-CM | POA: Diagnosis not present

## 2022-10-28 DIAGNOSIS — Z2821 Immunization not carried out because of patient refusal: Secondary | ICD-10-CM

## 2022-10-28 DIAGNOSIS — D352 Benign neoplasm of pituitary gland: Secondary | ICD-10-CM

## 2022-10-28 DIAGNOSIS — Z114 Encounter for screening for human immunodeficiency virus [HIV]: Secondary | ICD-10-CM | POA: Diagnosis not present

## 2022-10-28 DIAGNOSIS — Z7689 Persons encountering health services in other specified circumstances: Secondary | ICD-10-CM

## 2022-10-28 LAB — POCT URINALYSIS DIPSTICK
Bilirubin, UA: NEGATIVE
Blood, UA: NEGATIVE
Glucose, UA: NEGATIVE
Ketones, UA: NEGATIVE
Leukocytes, UA: NEGATIVE
Nitrite, UA: NEGATIVE
Protein, UA: NEGATIVE
Spec Grav, UA: 1.02 (ref 1.010–1.025)
Urobilinogen, UA: 0.2 E.U./dL
pH, UA: 7 (ref 5.0–8.0)

## 2022-10-28 MED ORDER — VALSARTAN-HYDROCHLOROTHIAZIDE 160-12.5 MG PO TABS: 1.0000 | ORAL_TABLET | ORAL | 1 refills | Status: DC | PRN

## 2022-10-28 NOTE — Patient Instructions (Addendum)
DASH Eating Plan DASH stands for Dietary Approaches to Stop Hypertension. The DASH eating plan is a healthy eating plan that has been shown to: Reduce high blood pressure (hypertension). Reduce your risk for type 2 diabetes, heart disease, and stroke. Help with weight loss. What are tips for following this plan? Reading food labels Check food labels for the amount of salt (sodium) per serving. Choose foods with less than 5 percent of the Daily Value of sodium. Generally, foods with less than 300 milligrams (mg) of sodium per serving fit into this eating plan. To find whole grains, look for the word "whole" as the first word in the ingredient list. Shopping Buy products labeled as "low-sodium" or "no salt added." Buy fresh foods. Avoid canned foods and pre-made or frozen meals. Cooking Avoid adding salt when cooking. Use salt-free seasonings or herbs instead of table salt or sea salt. Check with your health care provider or pharmacist before using salt substitutes. Do not fry foods. Cook foods using healthy methods such as baking, boiling, grilling, roasting, and broiling instead. Cook with heart-healthy oils, such as olive, canola, avocado, soybean, or sunflower oil. Meal planning  Eat a balanced diet that includes: 4 or more servings of fruits and 4 or more servings of vegetables each day. Try to fill one-half of your plate with fruits and vegetables. 6-8 servings of whole grains each day. Less than 6 oz (170 g) of lean meat, poultry, or fish each day. A 3-oz (85-g) serving of meat is about the same size as a deck of cards. One egg equals 1 oz (28 g). 2-3 servings of low-fat dairy each day. One serving is 1 cup (237 mL). 1 serving of nuts, seeds, or beans 5 times each week. 2-3 servings of heart-healthy fats. Healthy fats called omega-3 fatty acids are found in foods such as walnuts, flaxseeds, fortified milks, and eggs. These fats are also found in cold-water fish, such as sardines, salmon,  and mackerel. Limit how much you eat of: Canned or prepackaged foods. Food that is high in trans fat, such as some fried foods. Food that is high in saturated fat, such as fatty meat. Desserts and other sweets, sugary drinks, and other foods with added sugar. Full-fat dairy products. Do not salt foods before eating. Do not eat more than 4 egg yolks a week. Try to eat at least 2 vegetarian meals a week. Eat more home-cooked food and less restaurant, buffet, and fast food. Lifestyle When eating at a restaurant, ask that your food be prepared with less salt or no salt, if possible. If you drink alcohol: Limit how much you use to: 0-1 drink a day for women who are not pregnant. 0-2 drinks a day for men. Be aware of how much alcohol is in your drink. In the [place] one drink equals one 12 oz bottle of beer (355 mL), one 5 oz glass of wine (148 mL), or one 1 oz glass of hard liquor (44 mL). General information Avoid eating more than 2,300 mg of salt a day. If you have hypertension, you may need to reduce your sodium intake to 1,500 mg a day. Work with your health care provider to maintain a healthy body weight or to lose weight. Ask what an ideal weight is for you. Get at least 30 minutes of exercise that causes your heart to beat faster (aerobic exercise) most days of the week. Activities may include walking, swimming, or biking. Work with your health care provider or dietitian to   adjust your eating plan to your individual calorie needs. What foods should I eat? Fruits All fresh, dried, or frozen fruit. Canned fruit in natural juice (without added sugar). Vegetables Fresh or frozen vegetables (raw, steamed, roasted, or grilled). Low-sodium or reduced-sodium tomato and vegetable juice. Low-sodium or reduced-sodium tomato sauce and tomato paste. Low-sodium or reduced-sodium canned vegetables. Grains Whole-grain or whole-wheat bread. Whole-grain or whole-wheat pasta. Brown rice. Oatmeal. Quinoa.  Bulgur. Whole-grain and low-sodium cereals. Pita bread. Low-fat, low-sodium crackers. Whole-wheat flour tortillas. Meats and other proteins Skinless chicken or turkey. Ground chicken or turkey. Pork with fat trimmed off. Fish and seafood. Egg whites. Dried beans, peas, or lentils. Unsalted nuts, nut butters, and seeds. Unsalted canned beans. Lean cuts of beef with fat trimmed off. Low-sodium, lean precooked or cured meat, such as sausages or meat loaves. Dairy Low-fat (1%) or fat-free (skim) milk. Reduced-fat, low-fat, or fat-free cheeses. Nonfat, low-sodium ricotta or cottage cheese. Low-fat or nonfat yogurt. Low-fat, low-sodium cheese. Fats and oils Soft margarine without trans fats. Vegetable oil. Reduced-fat, low-fat, or light mayonnaise and salad dressings (reduced-sodium). Canola, safflower, olive, avocado, soybean, and sunflower oils. Avocado. Seasonings and condiments Herbs. Spices. Seasoning mixes without salt. Other foods Unsalted popcorn and pretzels. Fat-free sweets. The items listed above may not be a complete list of foods and beverages you can eat. Contact a dietitian for more information. What foods should I avoid? Fruits Canned fruit in a light or heavy syrup. Fried fruit. Fruit in cream or butter sauce. Vegetables Creamed or fried vegetables. Vegetables in a cheese sauce. Regular canned vegetables (not low-sodium or reduced-sodium). Regular canned tomato sauce and paste (not low-sodium or reduced-sodium). Regular tomato and vegetable juice (not low-sodium or reduced-sodium). Pickles. Olives. Grains Baked goods made with fat, such as croissants, muffins, or some breads. Dry pasta or rice meal packs. Meats and other proteins Fatty cuts of meat. Ribs. Fried meat. Bacon. Bologna, salami, and other precooked or cured meats, such as sausages or meat loaves. Fat from the back of a pig (fatback). Bratwurst. Salted nuts and seeds. Canned beans with added salt. Canned or smoked fish.  Whole eggs or egg yolks. Chicken or turkey with skin. Dairy Whole or 2% milk, cream, and half-and-half. Whole or full-fat cream cheese. Whole-fat or sweetened yogurt. Full-fat cheese. Nondairy creamers. Whipped toppings. Processed cheese and cheese spreads. Fats and oils Butter. Stick margarine. Lard. Shortening. Ghee. Bacon fat. Tropical oils, such as coconut, palm kernel, or palm oil. Seasonings and condiments Onion salt, garlic salt, seasoned salt, table salt, and sea salt. Worcestershire sauce. Tartar sauce. Barbecue sauce. Teriyaki sauce. Soy sauce, including reduced-sodium. Steak sauce. Canned and packaged gravies. Fish sauce. Oyster sauce. Cocktail sauce. Store-bought horseradish. Ketchup. Mustard. Meat flavorings and tenderizers. Bouillon cubes. Hot sauces. Pre-made or packaged marinades. Pre-made or packaged taco seasonings. Relishes. Regular salad dressings. Other foods Salted popcorn and pretzels. The items listed above may not be a complete list of foods and beverages you should avoid. Contact a dietitian for more information. Where to find more information National Heart, Lung, and Blood Institute: www.nhlbi.nih.gov American Heart Association: www.heart.org Academy of Nutrition and Dietetics: www.eatright.org National Kidney Foundation: www.kidney.org Summary The DASH eating plan is a healthy eating plan that has been shown to reduce high blood pressure (hypertension). It may also reduce your risk for type 2 diabetes, heart disease, and stroke. When on the DASH eating plan, aim to eat more fresh fruits and vegetables, whole grains, lean proteins, low-fat dairy, and heart-healthy fats. With the DASH   eating plan, you should limit salt (sodium) intake to 2,300 mg a day. If you have hypertension, you may need to reduce your sodium intake to 1,500 mg a day. Work with your health care provider or dietitian to adjust your eating plan to your individual calorie needs. This information is not  intended to replace advice given to you by your health care provider. Make sure you discuss any questions you have with your health care provider. Document Revised: 11/19/2019 Document Reviewed: 11/19/2019 Elsevier Patient Education  [place].   Hypertension, Adult Hypertension is another name for high blood pressure. High blood pressure forces your heart to work harder to pump blood. This can cause problems over time. There are two numbers in a blood pressure reading. There is a top number (systolic) over a bottom number (diastolic). It is best to have a blood pressure that is below 120/80. What are the causes? The cause of this condition is not known. Some other conditions can lead to high blood pressure. What increases the risk? Some lifestyle factors can make you more likely to develop high blood pressure: Smoking. Not getting enough exercise or physical activity. Being overweight. Having too much fat, sugar, calories, or salt (sodium) in your diet. Drinking too much alcohol. Other risk factors include: Having any of these conditions: Heart disease. Diabetes. High cholesterol. Kidney disease. Obstructive sleep apnea. Having a family history of high blood pressure and high cholesterol. Age. The risk increases with age. Stress. What are the signs or symptoms? High blood pressure may not cause symptoms. Very high blood pressure (hypertensive crisis) may cause: Headache. Fast or uneven heartbeats (palpitations). Shortness of breath. Nosebleed. Vomiting or feeling like you may vomit (nauseous). Changes in how you see. Very bad chest pain. Feeling dizzy. Seizures. How is this treated? This condition is treated by making healthy lifestyle changes, such as: Eating healthy foods. Exercising more. Drinking less alcohol. Your doctor may prescribe medicine if lifestyle changes do not help enough and if: Your top number is above 130. Your bottom number is above 80. Your  personal target blood pressure may vary. Follow these instructions at home: Eating and drinking  If told, follow the DASH eating plan. To follow this plan: Fill one half of your plate at each meal with fruits and vegetables. Fill one fourth of your plate at each meal with whole grains. Whole grains include whole-wheat pasta, brown rice, and whole-grain bread. Eat or drink low-fat dairy products, such as skim milk or low-fat yogurt. Fill one fourth of your plate at each meal with low-fat (lean) proteins. Low-fat proteins include fish, chicken without skin, eggs, beans, and tofu. Avoid fatty meat, cured and processed meat, or chicken with skin. Avoid pre-made or processed food. Limit the amount of salt in your diet to less than 1,500 mg each day. Do not drink alcohol if: Your doctor tells you not to drink. You are pregnant, may be pregnant, or are planning to become pregnant. If you drink alcohol: Limit how much you have to: 0-1 drink a day for women. 0-2 drinks a day for men. Know how much alcohol is in your drink. In the [place] one drink equals one 12 oz bottle of beer (355 mL), one 5 oz glass of wine (148 mL), or one 1 oz glass of hard liquor (44 mL). Lifestyle  Work with your doctor to stay at a healthy weight or to lose weight. Ask your doctor what the best weight is for you. Get at least  30 minutes of exercise that causes your heart to beat faster (aerobic exercise) most days of the week. This may include walking, swimming, or biking. Get at least 30 minutes of exercise that strengthens your muscles (resistance exercise) at least 3 days a week. This may include lifting weights or doing Pilates. Do not smoke or use any products that contain nicotine or tobacco. If you need help quitting, ask your doctor. Check your blood pressure at home as told by your doctor. Keep all follow-up visits. Medicines Take over-the-counter and prescription medicines only as told by your doctor. Follow  directions carefully. Do not skip doses of blood pressure medicine. The medicine does not work as well if you skip doses. Skipping doses also puts you at risk for problems. Ask your doctor about side effects or reactions to medicines that you should watch for. Contact a doctor if: You think you are having a reaction to the medicine you are taking. You have headaches that keep coming back. You feel dizzy. You have swelling in your ankles. You have trouble with your vision. Get help right away if: You get a very bad headache. You start to feel mixed up (confused). You feel weak or numb. You feel faint. You have very bad pain in your: Chest. Belly (abdomen). You vomit more than once. You have trouble breathing. These symptoms may be an emergency. Get help right away. Call 911. Do not wait to see if the symptoms will go away. Do not drive yourself to the hospital. Summary Hypertension is another name for high blood pressure. High blood pressure forces your heart to work harder to pump blood. For most people, a normal blood pressure is less than 120/80. Making healthy choices can help lower blood pressure. If your blood pressure does not get lower with healthy choices, you may need to take medicine. This information is not intended to replace advice given to you by your health care provider. Make sure you discuss any questions you have with your health care provider. Document Revised: 10/04/2021 Document Reviewed: 10/04/2021 Elsevier Patient Education  [place].

## 2022-10-28 NOTE — Progress Notes (Signed)
Barnet Glasgow Martin,acting as a Education administrator for Maximino Greenland, MD.,have documented all relevant documentation on the behalf of Maximino Greenland, MD,as directed by  Maximino Greenland, MD while in the presence of Maximino Greenland, MD.    Subjective:     Patient ID: April Mann , female    DOB: Sep 13, 1976 , 46 y.o.   MRN: [id number]   Chief Complaint  Patient presents with   Establish Care   Hypertension    HPI  Patient presents today to establish care. She is self-referred.  She reports being a patient about 20 years ago. She sees GYN for her pelvic exams, her provider recently retired. She is scheduled to establish care with a new GYN in January 2024.  She reports h/o high blood pressure. She has been on valsartan/hctz in the past. Last seen by previous PCP about 1-2 years ago; however, they still filled sporadically filled her rx. She admits she has not been taking regularly, because she did not want to run out. She has been taking meds intermittently, usually if she developed a headache or LE swelling.    She currently works at [employer]. She reports having irregular cycles as well. She has been on metformin in the past, but she didn't "feel right" while on this medication. She was also previously followed by [facility]. She denies family history of thyroid cancer. She would like to take medication to help with weight loss as well.   BP Readings from Last 3 Encounters: 10/28/22 : (!) 146/86 06/24/22 : 139/82 04/18/22 : 136/79       Past Medical History:  Diagnosis Date   Allergy    Anemia    Asthma    Back pain    Fibroid, uterine    Hypertension    Knee pain    Other fatigue    PCOS (polycystic ovarian syndrome)    Prediabetes    Shortness of breath on exertion    Vitamin D deficiency      Family History  Problem Relation Age of Onset   Fibromyalgia Mother    Lupus Maternal Grandmother    Hypertension Maternal Grandmother    Kidney disease  Maternal Grandfather    Diabetes Maternal Grandfather    Asthma Father    Hypertension Paternal Grandmother      Current Outpatient Medications:    Multiple Vitamin (MULTIVITAMIN WITH MINERALS) TABS tablet, Take 1 tablet by mouth daily., Disp: , Rfl:    Multiple Vitamins-Minerals (HAIR SKIN AND NAILS FORMULA) TABS, Take 1 tablet by mouth daily., Disp: , Rfl:    valsartan-hydrochlorothiazide (DIOVAN-HCT) 160-12.5 MG tablet, Take 1 tablet by mouth as needed., Disp: 90 tablet, Rfl: 1   Allergies  Allergen Reactions   Shellfish Allergy Other (See Comments)     Review of Systems  Constitutional: Negative.   HENT: Negative.    Eyes: Negative.   Respiratory: Negative.    Cardiovascular: Negative.   Gastrointestinal: Negative.      Today's Vitals   10/28/22 1121  BP: (!) 146/86  Pulse: 91  Temp: 98.4 F (36.9 C)  TempSrc: Oral  Weight: (!) 333 lb 12.8 oz (151.4 kg)  Height: _0  (1.575 m)  PainSc: 0-No pain   Body mass index is 61.05 kg/m.  Wt Readings from Last 3 Encounters:  10/28/22 (!) 333 lb 12.8 oz (151.4 kg)  06/24/22 (!) 333 lb (151 kg)  04/18/22 (!) 332 lb (150.6 kg)    Objective:  Physical  Exam Vitals and nursing note reviewed.  Constitutional:      Appearance: Normal appearance. She is obese.  HENT:     Head: Normocephalic and atraumatic.     Nose:     Comments: Masked     Mouth/Throat:     Comments: Masked  Eyes:     Extraocular Movements: Extraocular movements intact.     Conjunctiva/sclera: Conjunctivae normal.     Pupils: Pupils are equal, round, and reactive to light.  Cardiovascular:     Rate and Rhythm: Normal rate and regular rhythm.     Pulses: Normal pulses.     Heart sounds: Normal heart sounds.  Pulmonary:     Effort: Pulmonary effort is normal.     Breath sounds: Normal breath sounds.  Abdominal:     General: Bowel sounds are normal.     Palpations: Abdomen is soft.     Comments: Obese, soft  Genitourinary:    Comments:  deferred Musculoskeletal:        General: Normal range of motion.     Cervical back: Normal range of motion.  Skin:    General: Skin is warm and dry.  Neurological:     General: No focal deficit present.     Mental Status: She is alert and oriented to person, place, and time.  Psychiatric:        Mood and Affect: Mood normal.        Behavior: Behavior normal.      Assessment And Plan:     1. Essential hypertension, benign Comments: Chronic, fair control. EKG performed, NSR w/o acute changes. She will resume valsartan/hct 160/12.29m daily. She agrees to rto in 2 weeks for nurse visit. We also discussed the DASH eating plan as well. Importance of salt restriction was d/w patient.  - EKG 12-Lead - Microalbumin / Creatinine Urine Ratio - POCT Urinalysis Dipstick (81002) - valsartan-hydrochlorothiazide (DIOVAN-HCT) 160-12.5 MG tablet; Take 1 tablet by mouth as needed.  Dispense: 90 tablet; Refill: 1 - CMP14+EGFR - Amb Referral To Provider Referral Exercise Program (P.R.E.P)  2. Prediabetes Comments: Her a1c has been elevated in the past. I will recheck this today. She is encouraged to limit her intake of sugary foods, processed meats including bacon, sausages and deli meats. She also agrees to referral to the PREP program. She should aim for at least 150 minutes of exercise per week.  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 I will check prolactin level.  This likely contributes to her irregular cycles.  - Prolactin  4. History of anemia - CBC no Diff  5. Need for hepatitis C screening test - Hepatitis C antibody  6. Immunization declined  7. Screening for HIV (human immunodeficiency virus) - HIV antibody (with reflex)  8. Establishing care with new doctor, encounter for   Patient was given opportunity to ask questions. Patient verbalized understanding of the plan and was  able to repeat key elements of the plan. All questions were answered to their satisfaction.   I, RMaximino Greenland MD, have reviewed all documentation for this visit. The documentation on 10/28/22 for the exam, diagnosis, procedures, and orders are all accurate and complete.   IF YOU HAVE BEEN REFERRED TO A SPECIALIST, IT MAY TAKE 1-2 WEEKS TO SCHEDULE/PROCESS THE REFERRAL. IF YOU HAVE NOT HEARD FROM US/SPECIALIST IN TWO WEEKS, PLEASE GIVE [place] CALL AT [phone].   THE PATIENT  IS ENCOURAGED TO PRACTICE SOCIAL DISTANCING DUE TO THE COVID-19 PANDEMIC.

## 2022-10-29 LAB — CMP14+EGFR
ALT: 22 IU/L (ref 0–32)
AST: 18 IU/L (ref 0–40)
Albumin/Globulin Ratio: 1.3 (ref 1.2–2.2)
Albumin: 4 g/dL (ref 3.9–4.9)
Alkaline Phosphatase: 114 IU/L (ref 44–121)
BUN/Creatinine Ratio: 15 (ref 9–23)
BUN: 9 mg/dL (ref 6–24)
Bilirubin Total: <0.2 mg/dL (ref 0.0–1.2)
CO2: 24 mmol/L (ref 20–29)
Calcium: 10 mg/dL (ref 8.7–10.2)
Chloride: 100 mmol/L (ref 96–106)
Creatinine, Ser: 0.61 mg/dL (ref 0.57–1.00)
Globulin, Total: 3.1 g/dL (ref 1.5–4.5)
Glucose: 98 mg/dL (ref 70–99)
Potassium: 4 mmol/L (ref 3.5–5.2)
Sodium: 137 mmol/L (ref 134–144)
Total Protein: 7.1 g/dL (ref 6.0–8.5)
eGFR: 111 mL/min/{1.73_m2} (ref >59)

## 2022-10-29 LAB — CBC
Hematocrit: 39.7 % (ref 34.0–46.6)
Hemoglobin: 12.3 g/dL (ref 11.1–15.9)
MCH: 22.8 pg — ABNORMAL LOW (ref 26.6–33.0)
MCHC: 31 g/dL — ABNORMAL LOW (ref 31.5–35.7)
MCV: 74 fL — ABNORMAL LOW (ref 79–97)
Platelets: 353 10*3/uL (ref 150–450)
RBC: 5.4 x10E6/uL — ABNORMAL HIGH (ref 3.77–5.28)
RDW: 14.5 % (ref 11.7–15.4)
WBC: 9.8 10*3/uL (ref 3.4–10.8)

## 2022-10-29 LAB — HEMOGLOBIN A1C
Est. average glucose Bld gHb Est-mCnc: 128 mg/dL
Hgb A1c MFr Bld: 6.1 % — ABNORMAL HIGH (ref 4.8–5.6)

## 2022-10-29 LAB — MICROALBUMIN / CREATININE URINE RATIO
Creatinine, Urine: 47 mg/dL
Microalb/Creat Ratio: <6 mg/g creat (ref 0–29)
Microalbumin, Urine: <3 ug/mL

## 2022-10-29 LAB — PROLACTIN: Prolactin: 243 ng/mL — ABNORMAL HIGH (ref 4.8–23.3)

## 2022-10-29 LAB — HEPATITIS C ANTIBODY: Hep C Virus Ab: NONREACTIVE

## 2022-10-29 LAB — HIV ANTIBODY (ROUTINE TESTING W REFLEX): HIV Screen 4th Generation wRfx: NONREACTIVE

## 2022-11-01 ENCOUNTER — Telehealth: Payer: Self-pay

## 2022-11-01 NOTE — Telephone Encounter (Signed)
Call to pt reference PREP referral. Interested in participating. Needs evenings and can start in Jan 2024 at [facility]. Will call her closer to the start of class for initial coaching and measurements

## 2022-11-12 ENCOUNTER — Ambulatory Visit: Payer: BC Managed Care – PPO

## 2022-11-12 VITALS — BP 140/92 | HR 99 | Temp 98.3°F

## 2022-11-12 DIAGNOSIS — I10 Essential (primary) hypertension: Secondary | ICD-10-CM

## 2022-11-12 MED ORDER — AMLODIPINE BESYLATE 2.5 MG PO TABS: 2.5000 mg | ORAL_TABLET | Freq: Every day | ORAL | 11 refills | Status: DC

## 2022-11-12 NOTE — Progress Notes (Signed)
Patient presents today for BPC. She is currently taking valsartan-hctz 160-12.'5mg'$    BP Readings from Last 3 Encounters:  11/12/22 (!) 146/94  10/28/22 (!) 146/86  06/24/22 139/82   Provider would like to start Amlodipine 2.'5mg'$  nightly. She will return in 2 weeks for nurse visit [place].

## 2022-11-26 ENCOUNTER — Ambulatory Visit: Payer: BC Managed Care – PPO

## 2022-12-03 ENCOUNTER — Ambulatory Visit: Payer: BC Managed Care – PPO

## 2022-12-03 VITALS — BP 132/70 | HR 82 | Temp 98.2°F

## 2022-12-03 DIAGNOSIS — I10 Essential (primary) hypertension: Secondary | ICD-10-CM

## 2022-12-03 DIAGNOSIS — R030 Elevated blood-pressure reading, without diagnosis of hypertension: Secondary | ICD-10-CM | POA: Diagnosis not present

## 2022-12-03 NOTE — Patient Instructions (Signed)
Hypertension, Adult High blood pressure (hypertension) is when the force of blood pumping through the arteries is too strong. The arteries are the blood vessels that carry blood from the heart throughout the body. Hypertension forces the heart to work harder to pump blood and may cause arteries to become narrow or stiff. Untreated or uncontrolled hypertension can lead to a heart attack, heart failure, a stroke, kidney disease, and other problems. A blood pressure reading consists of a higher number over a lower number. Ideally, your blood pressure should be below 120/80. The first ("top") number is called the systolic pressure. It is a measure of the pressure in your arteries as your heart beats. The second ("bottom") number is called the diastolic pressure. It is a measure of the pressure in your arteries as the heart relaxes. What are the causes? The exact cause of this condition is not known. There are some conditions that result in high blood pressure. What increases the risk? Certain factors may make you more likely to develop high blood pressure. Some of these risk factors are under your control, including: Smoking. Not getting enough exercise or physical activity. Being overweight. Having too much fat, sugar, calories, or salt (sodium) in your diet. Drinking too much alcohol. Other risk factors include: Having a personal history of heart disease, diabetes, high cholesterol, or kidney disease. Stress. Having a family history of high blood pressure and high cholesterol. Having obstructive sleep apnea. Age. The risk increases with age. What are the signs or symptoms? High blood pressure may not cause symptoms. Very high blood pressure (hypertensive crisis) may cause: Headache. Fast or irregular heartbeats (palpitations). Shortness of breath. Nosebleed. Nausea and vomiting. Vision changes. Severe chest pain, dizziness, and seizures. How is this diagnosed? This condition is diagnosed by  measuring your blood pressure while you are seated, with your arm resting on a flat surface, your legs uncrossed, and your feet flat on the floor. The cuff of the blood pressure monitor will be placed directly against the skin of your upper arm at the level of your heart. Blood pressure should be measured at least twice using the same arm. Certain conditions can cause a difference in blood pressure between your right and left arms. If you have a high blood pressure reading during one visit or you have normal blood pressure with other risk factors, you may be asked to: Return on a different day to have your blood pressure checked again. Monitor your blood pressure at home for 1 week or longer. If you are diagnosed with hypertension, you may have other blood or imaging tests to help your health care provider understand your overall risk for other conditions. How is this treated? This condition is treated by making healthy lifestyle changes, such as eating healthy foods, exercising more, and reducing your alcohol intake. You may be referred for counseling on a healthy diet and physical activity. Your health care provider may prescribe medicine if lifestyle changes are not enough to get your blood pressure under control and if: Your systolic blood pressure is above 130. Your diastolic blood pressure is above 80. Your personal target blood pressure may vary depending on your medical conditions, your age, and other factors. Follow these instructions at home: Eating and drinking  Eat a diet that is high in fiber and potassium, and low in sodium, added sugar, and fat. An example of this eating plan is called the DASH diet. DASH stands for Dietary Approaches to Stop Hypertension. To eat this way: Eat   plenty of fresh fruits and vegetables. Try to fill one half of your plate at each meal with fruits and vegetables. Eat whole grains, such as whole-wheat pasta, brown rice, or whole-grain bread. Fill about one  fourth of your plate with whole grains. Eat or drink low-fat dairy products, such as skim milk or low-fat yogurt. Avoid fatty cuts of meat, processed or cured meats, and poultry with skin. Fill about one fourth of your plate with lean proteins, such as fish, chicken without skin, beans, eggs, or tofu. Avoid pre-made and processed foods. These tend to be higher in sodium, added sugar, and fat. Reduce your daily sodium intake. Many people with hypertension should eat less than 1,500 mg of sodium a day. Do not drink alcohol if: Your health care provider tells you not to drink. You are pregnant, may be pregnant, or are planning to become pregnant. If you drink alcohol: Limit how much you have to: 0-1 drink a day for women. 0-2 drinks a day for men. Know how much alcohol is in your drink. In the [place] one drink equals one 12 oz bottle of beer (355 mL), one 5 oz glass of wine (148 mL), or one 1 oz glass of hard liquor (44 mL). Lifestyle  Work with your health care provider to maintain a healthy body weight or to lose weight. Ask what an ideal weight is for you. Get at least 30 minutes of exercise that causes your heart to beat faster (aerobic exercise) most days of the week. Activities may include walking, swimming, or biking. Include exercise to strengthen your muscles (resistance exercise), such as Pilates or lifting weights, as part of your weekly exercise routine. Try to do these types of exercises for 30 minutes at least 3 days a week. Do not use any products that contain nicotine or tobacco. These products include cigarettes, chewing tobacco, and vaping devices, such as e-cigarettes. If you need help quitting, ask your health care provider. Monitor your blood pressure at home as told by your health care provider. Keep all follow-up visits. This is important. Medicines Take over-the-counter and prescription medicines only as told by your health care provider. Follow directions carefully. Blood  pressure medicines must be taken as prescribed. Do not skip doses of blood pressure medicine. Doing this puts you at risk for problems and can make the medicine less effective. Ask your health care provider about side effects or reactions to medicines that you should watch for. Contact a health care provider if you: Think you are having a reaction to a medicine you are taking. Have headaches that keep coming back (recurring). Feel dizzy. Have swelling in your ankles. Have trouble with your vision. Get help right away if you: Develop a severe headache or confusion. Have unusual weakness or numbness. Feel faint. Have severe pain in your chest or abdomen. Vomit repeatedly. Have trouble breathing. These symptoms may be an emergency. Get help right away. Call 911. Do not wait to see if the symptoms will go away. Do not drive yourself to the hospital. Summary Hypertension is when the force of blood pumping through your arteries is too strong. If this condition is not controlled, it may put you at risk for serious complications. Your personal target blood pressure may vary depending on your medical conditions, your age, and other factors. For most people, a normal blood pressure is less than 120/80. Hypertension is treated with lifestyle changes, medicines, or a combination of both. Lifestyle changes include losing weight, eating a healthy,   low-sodium diet, exercising more, and limiting alcohol. This information is not intended to replace advice given to you by your health care provider. Make sure you discuss any questions you have with your health care provider. Document Revised: 10/23/2021 Document Reviewed: 10/23/2021 Elsevier Patient Education  2023 [employer]  

## 2022-12-03 NOTE — Progress Notes (Signed)
Patient presents today for BPC. She is currently taking Amlodipine 2.5 and valsartan-hctz 160/12.'5mg'$ .   BP Readings from Last 3 Encounters:  12/03/22 132/70  11/12/22 (!) 140/92  10/28/22 (!) 146/86

## 2022-12-08 ENCOUNTER — Encounter: Payer: Self-pay | Admitting: Internal Medicine

## 2022-12-09 ENCOUNTER — Other Ambulatory Visit: Payer: Self-pay

## 2022-12-09 MED ORDER — VITAMIN D (ERGOCALCIFEROL) 1.25 MG (50000 UNIT) PO CAPS: 50000.0000 [IU] | ORAL_CAPSULE | ORAL | 1 refills | Status: AC

## 2023-01-28 ENCOUNTER — Other Ambulatory Visit (HOSPITAL_COMMUNITY)
Admission: RE | Admit: 2023-01-28 | Discharge: 2023-01-28 | Disposition: A | Discharge status: Home / Self Care | Payer: BC Managed Care – PPO | Source: Ambulatory Visit | Attending: Obstetrics and Gynecology | Admitting: Obstetrics and Gynecology

## 2023-01-28 ENCOUNTER — Other Ambulatory Visit: Payer: Self-pay | Admitting: Obstetrics and Gynecology

## 2023-01-28 DIAGNOSIS — Z01419 Encounter for gynecological examination (general) (routine) without abnormal findings: Secondary | ICD-10-CM | POA: Insufficient documentation

## 2023-01-28 DIAGNOSIS — N915 Oligomenorrhea, unspecified: Secondary | ICD-10-CM | POA: Diagnosis not present

## 2023-01-28 DIAGNOSIS — D259 Leiomyoma of uterus, unspecified: Secondary | ICD-10-CM | POA: Diagnosis not present

## 2023-01-28 DIAGNOSIS — D219 Benign neoplasm of connective and other soft tissue, unspecified: Secondary | ICD-10-CM

## 2023-01-30 LAB — CYTOLOGY - PAP
Adequacy: ABSENT
Comment: NEGATIVE
Diagnosis: NEGATIVE
High risk HPV: NEGATIVE

## 2023-02-03 ENCOUNTER — Encounter: Payer: Self-pay | Admitting: Internal Medicine

## 2023-02-03 ENCOUNTER — Ambulatory Visit: Payer: BC Managed Care – PPO | Admitting: Internal Medicine

## 2023-02-03 ENCOUNTER — Telehealth: Payer: Self-pay

## 2023-02-03 ENCOUNTER — Other Ambulatory Visit (HOSPITAL_COMMUNITY): Payer: Self-pay

## 2023-02-03 VITALS — BP 132/90 | HR 80 | Temp 98.4°F | Ht 62.0 in | Wt 342.4 lb

## 2023-02-03 DIAGNOSIS — R7303 Prediabetes: Secondary | ICD-10-CM | POA: Diagnosis not present

## 2023-02-03 DIAGNOSIS — I10 Essential (primary) hypertension: Secondary | ICD-10-CM

## 2023-02-03 DIAGNOSIS — Z6841 Body Mass Index (BMI) 40.0 and over, adult: Secondary | ICD-10-CM

## 2023-02-03 DIAGNOSIS — Z1211 Encounter for screening for malignant neoplasm of colon: Secondary | ICD-10-CM

## 2023-02-03 DIAGNOSIS — Z862 Personal history of diseases of the blood and blood-forming organs and certain disorders involving the immune mechanism: Secondary | ICD-10-CM

## 2023-02-03 DIAGNOSIS — D352 Benign neoplasm of pituitary gland: Secondary | ICD-10-CM

## 2023-02-03 MED ORDER — WEGOVY 1 MG/0.5ML ~~LOC~~ SOAJ
1.0000 mg | SUBCUTANEOUS | 0 refills | Status: AC
  Filled 2023-02-03 – 2023-03-07 (×3): qty 2, 28d supply, fill #0

## 2023-02-03 NOTE — Telephone Encounter (Signed)
Looked at pharmacy benefits

## 2023-02-03 NOTE — Progress Notes (Signed)
I,Victoria T Hamilton,acting as a scribe for Maximino Greenland, MD.,have documented all relevant documentation on the behalf of Maximino Greenland, MD,as directed by  Maximino Greenland, MD while in the presence of Maximino Greenland, MD.    Subjective:     Patient ID: April Mann , female    DOB: December 04, 1976 , 47 y.o.   MRN: [id number]   Chief Complaint  Patient presents with   Hypertension   Prediabetes    HPI  Pt presents today for BP & Prediabetes f/u. Denies headache, chest pain, SOB , blurred vision. She states bp was 110/70 She states having no specific questions or concerns.     Hypertension This is a chronic problem. The current episode started more than 1 year ago. The problem has been gradually improving since onset. The problem is uncontrolled. Pertinent negatives include no blurred vision, chest pain, palpitations or shortness of breath. Risk factors for coronary artery disease include obesity. Past treatments include angiotensin blockers, diuretics and calcium channel blockers.     Past Medical History:  Diagnosis Date   Allergy    Anemia    Asthma    Back pain    Fibroid, uterine    Hypertension    Knee pain    Other fatigue    PCOS (polycystic ovarian syndrome)    Prediabetes    Shortness of breath on exertion    Vitamin D deficiency      Family History  Problem Relation Age of Onset   Fibromyalgia Mother    Lupus Maternal Grandmother    Hypertension Maternal Grandmother    Kidney disease Maternal Grandfather    Diabetes Maternal Grandfather    Asthma Father    Hypertension Paternal Grandmother      Current Outpatient Medications:    amLODipine (NORVASC) 2.5 MG tablet, Take 1 tablet (2.5 mg total) by mouth daily., Disp: 30 tablet, Rfl: 11   Multiple Vitamin (MULTIVITAMIN WITH MINERALS) TABS tablet, Take 1 tablet by mouth daily., Disp: , Rfl:    Multiple Vitamins-Minerals (HAIR SKIN AND NAILS FORMULA) TABS, Take 1 tablet by mouth daily., Disp: , Rfl:     Semaglutide-Weight Management (WEGOVY) 1 MG/0.5ML SOAJ, Inject 1 mg into the skin once a week., Disp: 2 mL, Rfl: 0   valsartan-hydrochlorothiazide (DIOVAN-HCT) 160-12.5 MG tablet, Take 1 tablet by mouth as needed., Disp: 90 tablet, Rfl: 1   Vitamin D, Ergocalciferol, (DRISDOL) 1.25 MG (50000 UNIT) CAPS capsule, Take 1 capsule (50,000 Units total) by mouth every 7 (seven) days., Disp: 15 capsule, Rfl: 1   Allergies  Allergen Reactions   Shellfish Allergy Other (See Comments)     Review of Systems  Constitutional: Negative.   Eyes:  Negative for blurred vision.  Respiratory: Negative.  Negative for shortness of breath.   Cardiovascular: Negative.  Negative for chest pain and palpitations.  Neurological: Negative.   Psychiatric/Behavioral: Negative.       Today's Vitals   02/03/23 1538  BP: (!) 132/90  Pulse: 80  Temp: 98.4 F (36.9 C)  SpO2: 98%  Weight: (!) 342 lb 6.4 oz (155.3 kg)  Height: '5\' 2"'$  (1.575 m)   Body mass index is 62.63 kg/m.  Wt Readings from Last 3 Encounters:  02/03/23 (!) 342 lb 6.4 oz (155.3 kg)  10/28/22 (!) 333 lb 12.8 oz (151.4 kg)  06/24/22 (!) 333 lb (151 kg)    Objective:  Physical Exam Vitals and nursing note reviewed.  Constitutional:      Appearance: Normal  appearance.  HENT:     Head: Normocephalic and atraumatic.     Nose:     Comments: Masked     Mouth/Throat:     Comments: Masked  Eyes:     Extraocular Movements: Extraocular movements intact.  Cardiovascular:     Rate and Rhythm: Normal rate and regular rhythm.     Heart sounds: Normal heart sounds.  Pulmonary:     Effort: Pulmonary effort is normal.     Breath sounds: Normal breath sounds.  Skin:    General: Skin is warm.  Neurological:     General: No focal deficit present.     Mental Status: She is alert.  Psychiatric:        Mood and Affect: Mood normal.        Behavior: Behavior normal.      Assessment And Plan:     1. Essential hypertension, benign Comments:  Chronic, fair control. She is encouraged to follow low sodium diet. She will c/w amlodipine 2.'5mg'$  and valsartan 160/12.'5mg'$  qd. Advised to follow low salt diet. - CMP14+EGFR - Hemoglobin A1c  2. Prediabetes Comments: Previous labs reviewed, she has had elevated hba1c in the past. i will recheck an a1c today. She is encouraged to limit sugary beverages/foods. - CMP14+EGFR - Hemoglobin A1c  3. Screening for colon cancer Comments: She agrees to Cologuard testing.  She does not wish to have colonoscopy at this time. - Cologuard  4. Class 3 severe obesity due to excess calories with serious comorbidity and body mass index (BMI) of 60.0 to 69.9 in adult [facility]) Patient has not met goal of at least 5% of body weight loss with comprehensive lifestyle modifications alone in the past 3-6 months. Pharmacotherapy is appropriate to pursue as augmentation. Will re-start Wegovy.  She understands concerns for product shortages and the concerns of starting/stopping in relation to GI distress.  I confirmed patient not pregnant and no personal or family history of medullary thyroid carcinoma (MTC) or Multiple Endocrine Neoplasia syndrome type 2 (MEN 2).    Advised patient on common side effects including nausea, diarrhea, dyspepsia, decreased appetite, and fatigue. Counseled patient on reducing meal size and how to titrate medication to minimize side effects. Patient aware to call if intolerable side effects or if experiencing dehydration, abdominal pain, or dizziness. Patient will adhere to dietary modifications and will target at least 150 minutes of moderate intensity exercise weekly.     Patient was given opportunity to ask questions. Patient verbalized understanding of the plan and was able to repeat key elements of the plan. All questions were answered to their satisfaction.   I, Maximino Greenland, MD, have reviewed all documentation for this visit. The documentation on 02/03/23 for the exam, diagnosis,  procedures, and orders are all accurate and complete.   IF YOU HAVE BEEN REFERRED TO A SPECIALIST, IT MAY TAKE 1-2 WEEKS TO SCHEDULE/PROCESS THE REFERRAL. IF YOU HAVE NOT HEARD FROM US/SPECIALIST IN TWO WEEKS, PLEASE GIVE [place] A CALL AT [phone].   THE PATIENT IS ENCOURAGED TO PRACTICE SOCIAL DISTANCING DUE TO THE COVID-19 PANDEMIC.

## 2023-02-03 NOTE — Patient Instructions (Addendum)
Magnesium glycinate, one capsule nightly Tart cherry juice   Hypertension, Adult Hypertension is another name for high blood pressure. High blood pressure forces your heart to work harder to pump blood. This can cause problems over time. There are two numbers in a blood pressure reading. There is a top number (systolic) over a bottom number (diastolic). It is best to have a blood pressure that is below 120/80. What are the causes? The cause of this condition is not known. Some other conditions can lead to high blood pressure. What increases the risk? Some lifestyle factors can make you more likely to develop high blood pressure: Smoking. Not getting enough exercise or physical activity. Being overweight. Having too much fat, sugar, calories, or salt (sodium) in your diet. Drinking too much alcohol. Other risk factors include: Having any of these conditions: Heart disease. Diabetes. High cholesterol. Kidney disease. Obstructive sleep apnea. Having a family history of high blood pressure and high cholesterol. Age. The risk increases with age. Stress. What are the signs or symptoms? High blood pressure may not cause symptoms. Very high blood pressure (hypertensive crisis) may cause: Headache. Fast or uneven heartbeats (palpitations). Shortness of breath. Nosebleed. Vomiting or feeling like you may vomit (nauseous). Changes in how you see. Very bad chest pain. Feeling dizzy. Seizures. How is this treated? This condition is treated by making healthy lifestyle changes, such as: Eating healthy foods. Exercising more. Drinking less alcohol. Your doctor may prescribe medicine if lifestyle changes do not help enough and if: Your top number is above 130. Your bottom number is above 80. Your personal target blood pressure may vary. Follow these instructions at home: Eating and drinking  If told, follow the DASH eating plan. To follow this plan: Fill one half of your plate at  each meal with fruits and vegetables. Fill one fourth of your plate at each meal with whole grains. Whole grains include whole-wheat pasta, brown rice, and whole-grain bread. Eat or drink low-fat dairy products, such as skim milk or low-fat yogurt. Fill one fourth of your plate at each meal with low-fat (lean) proteins. Low-fat proteins include fish, chicken without skin, eggs, beans, and tofu. Avoid fatty meat, cured and processed meat, or chicken with skin. Avoid pre-made or processed food. Limit the amount of salt in your diet to less than 1,500 mg each day. Do not drink alcohol if: Your doctor tells you not to drink. You are pregnant, may be pregnant, or are planning to become pregnant. If you drink alcohol: Limit how much you have to: 0-1 drink a day for women. 0-2 drinks a day for men. Know how much alcohol is in your drink. In the [place] one drink equals one 12 oz bottle of beer (355 mL), one 5 oz glass of wine (148 mL), or one 1 oz glass of hard liquor (44 mL). Lifestyle  Work with your doctor to stay at a healthy weight or to lose weight. Ask your doctor what the best weight is for you. Get at least 30 minutes of exercise that causes your heart to beat faster (aerobic exercise) most days of the week. This may include walking, swimming, or biking. Get at least 30 minutes of exercise that strengthens your muscles (resistance exercise) at least 3 days a week. This may include lifting weights or doing Pilates. Do not smoke or use any products that contain nicotine or tobacco. If you need help quitting, ask your doctor. Check your blood pressure at home as told by your doctor.  Keep all follow-up visits. Medicines Take over-the-counter and prescription medicines only as told by your doctor. Follow directions carefully. Do not skip doses of blood pressure medicine. The medicine does not work as well if you skip doses. Skipping doses also puts you at risk for problems. Ask your doctor  about side effects or reactions to medicines that you should watch for. Contact a doctor if: You think you are having a reaction to the medicine you are taking. You have headaches that keep coming back. You feel dizzy. You have swelling in your ankles. You have trouble with your vision. Get help right away if: You get a very bad headache. You start to feel mixed up (confused). You feel weak or numb. You feel faint. You have very bad pain in your: Chest. Belly (abdomen). You vomit more than once. You have trouble breathing. These symptoms may be an emergency. Get help right away. Call 911. Do not wait to see if the symptoms will go away. Do not drive yourself to the hospital. Summary Hypertension is another name for high blood pressure. High blood pressure forces your heart to work harder to pump blood. For most people, a normal blood pressure is less than 120/80. Making healthy choices can help lower blood pressure. If your blood pressure does not get lower with healthy choices, you may need to take medicine. This information is not intended to replace advice given to you by your health care provider. Make sure you discuss any questions you have with your health care provider. Document Revised: 10/04/2021 Document Reviewed: 10/04/2021 Elsevier Patient Education  [place].

## 2023-02-04 LAB — CMP14+EGFR
ALT: 23 IU/L (ref 0–32)
AST: 17 IU/L (ref 0–40)
Albumin/Globulin Ratio: 1.2 (ref 1.2–2.2)
Albumin: 4 g/dL (ref 3.9–4.9)
Alkaline Phosphatase: 113 IU/L (ref 44–121)
BUN/Creatinine Ratio: 12 (ref 9–23)
BUN: 8 mg/dL (ref 6–24)
Bilirubin Total: <0.2 mg/dL (ref 0.0–1.2)
CO2: 23 mmol/L (ref 20–29)
Calcium: 9.9 mg/dL (ref 8.7–10.2)
Chloride: 103 mmol/L (ref 96–106)
Creatinine, Ser: 0.66 mg/dL (ref 0.57–1.00)
Globulin, Total: 3.3 g/dL (ref 1.5–4.5)
Glucose: 89 mg/dL (ref 70–99)
Potassium: 4.1 mmol/L (ref 3.5–5.2)
Sodium: 138 mmol/L (ref 134–144)
Total Protein: 7.3 g/dL (ref 6.0–8.5)
eGFR: 109 mL/min/{1.73_m2} (ref >59)

## 2023-02-04 LAB — HEMOGLOBIN A1C
Est. average glucose Bld gHb Est-mCnc: 128 mg/dL
Hgb A1c MFr Bld: 6.1 % — ABNORMAL HIGH (ref 4.8–5.6)

## 2023-02-07 ENCOUNTER — Encounter: Payer: Self-pay | Admitting: Internal Medicine

## 2023-02-08 ENCOUNTER — Other Ambulatory Visit (HOSPITAL_COMMUNITY): Payer: Self-pay

## 2023-02-10 ENCOUNTER — Other Ambulatory Visit (HOSPITAL_COMMUNITY): Payer: Self-pay

## 2023-02-13 ENCOUNTER — Encounter: Payer: Self-pay | Admitting: Internal Medicine

## 2023-02-13 ENCOUNTER — Other Ambulatory Visit (HOSPITAL_COMMUNITY): Payer: Self-pay

## 2023-02-18 ENCOUNTER — Encounter (INDEPENDENT_AMBULATORY_CARE_PROVIDER_SITE_OTHER): Payer: Self-pay

## 2023-02-18 ENCOUNTER — Ambulatory Visit
Admission: RE | Admit: 2023-02-18 | Discharge: 2023-02-18 | Disposition: A | Discharge status: Home / Self Care | Payer: BC Managed Care – PPO | Source: Ambulatory Visit | Attending: Obstetrics and Gynecology | Admitting: Obstetrics and Gynecology

## 2023-02-18 DIAGNOSIS — D252 Subserosal leiomyoma of uterus: Secondary | ICD-10-CM | POA: Diagnosis not present

## 2023-02-18 DIAGNOSIS — Z1211 Encounter for screening for malignant neoplasm of colon: Secondary | ICD-10-CM | POA: Diagnosis not present

## 2023-02-18 DIAGNOSIS — D251 Intramural leiomyoma of uterus: Secondary | ICD-10-CM | POA: Diagnosis not present

## 2023-02-18 DIAGNOSIS — D219 Benign neoplasm of connective and other soft tissue, unspecified: Secondary | ICD-10-CM

## 2023-02-20 ENCOUNTER — Other Ambulatory Visit: Payer: Self-pay | Admitting: Internal Medicine

## 2023-02-20 DIAGNOSIS — Z1231 Encounter for screening mammogram for malignant neoplasm of breast: Secondary | ICD-10-CM

## 2023-02-22 DIAGNOSIS — R7303 Prediabetes: Secondary | ICD-10-CM | POA: Insufficient documentation

## 2023-02-22 DIAGNOSIS — E66813 Obesity, class 3: Secondary | ICD-10-CM | POA: Insufficient documentation

## 2023-02-27 ENCOUNTER — Encounter: Payer: Self-pay | Admitting: Internal Medicine

## 2023-02-28 ENCOUNTER — Other Ambulatory Visit: Payer: Self-pay | Admitting: Internal Medicine

## 2023-02-28 DIAGNOSIS — I10 Essential (primary) hypertension: Secondary | ICD-10-CM

## 2023-03-01 LAB — COLOGUARD: COLOGUARD: NEGATIVE

## 2023-03-06 ENCOUNTER — Encounter: Payer: Self-pay | Admitting: Internal Medicine

## 2023-03-07 ENCOUNTER — Other Ambulatory Visit (HOSPITAL_COMMUNITY): Payer: Self-pay

## 2023-04-01 IMAGING — MG MM DIGITAL SCREENING BILAT W/ TOMO AND CAD
8 of 13 series · 8 of 37 positions shown · non-contrast
Comparison: Previous exam(s).

CLINICAL DATA: Screening.

EXAM:
DIGITAL SCREENING BILATERAL MAMMOGRAM WITH TOMOSYNTHESIS AND CAD
TECHNIQUE: Bilateral screening digital craniocaudal and mediolateral oblique
mammograms were obtained. Bilateral screening digital breast
tomosynthesis was performed. The images were evaluated with
computer-aided detection.

[R MLO synth-2D (1 of 2)]
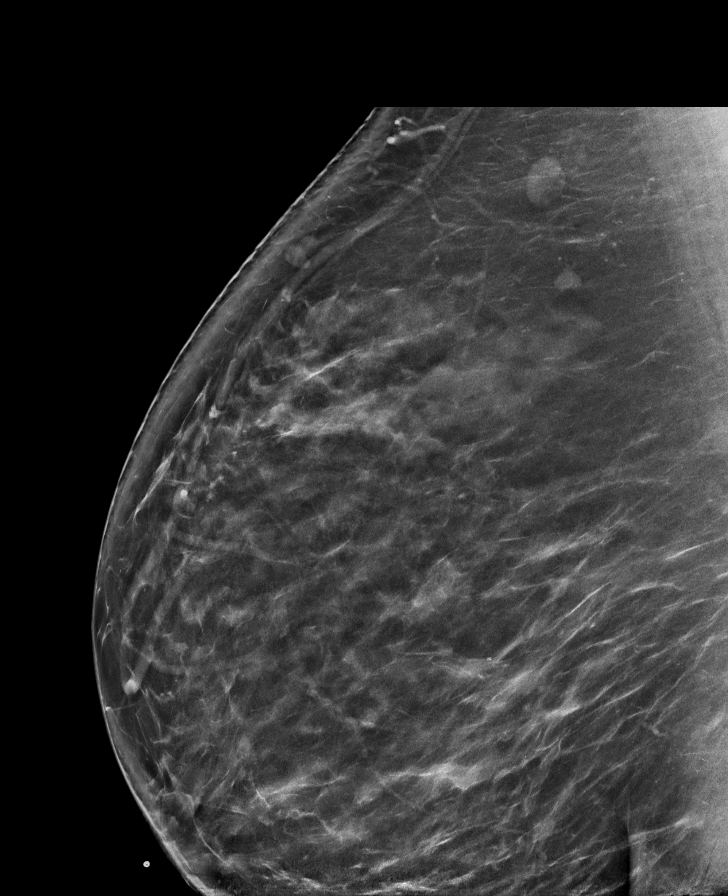

[R CC synth-2D]
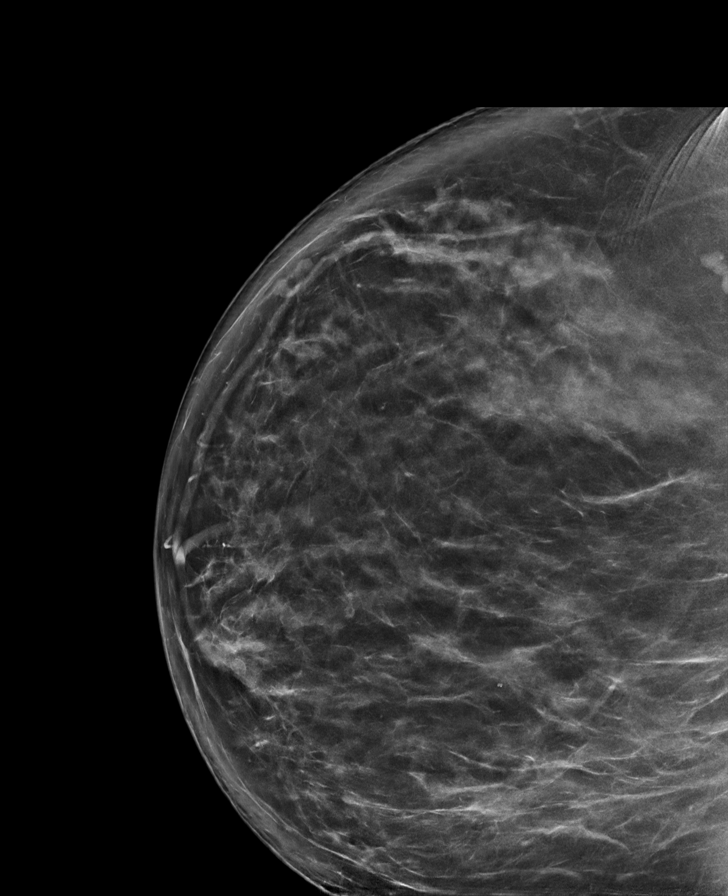

[L CC synth-2D]
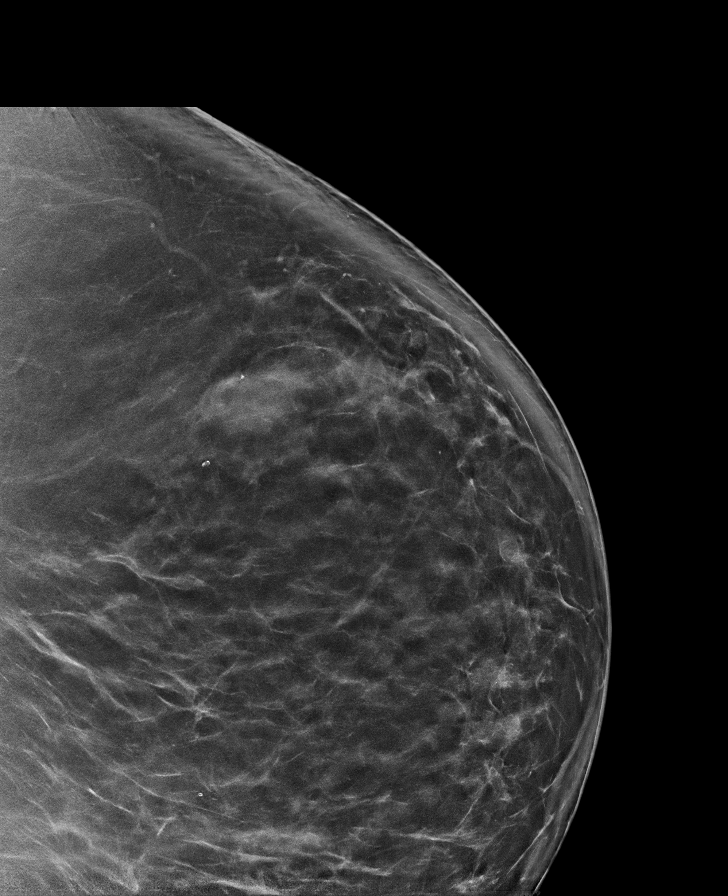

[R CV synth-2D]
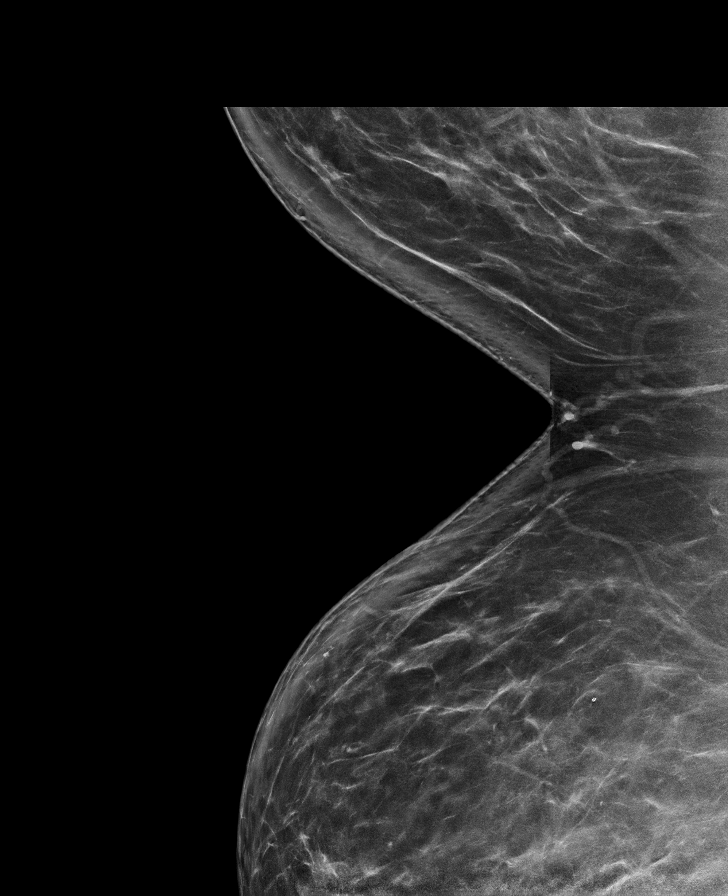

[R MLO synth-2D (2 of 2)]
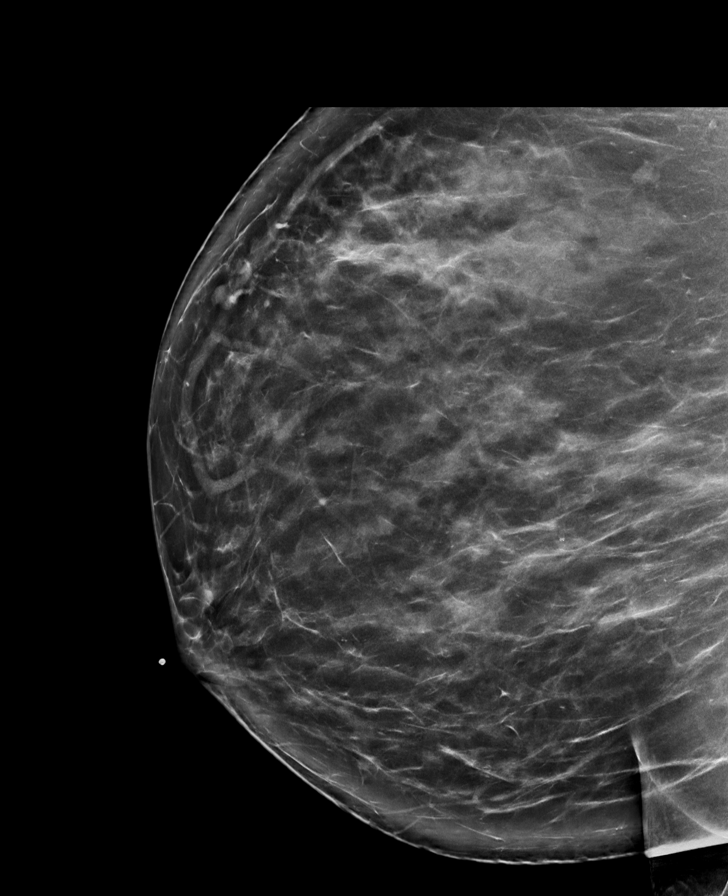

[L MLO synth-2D (1 of 2)]
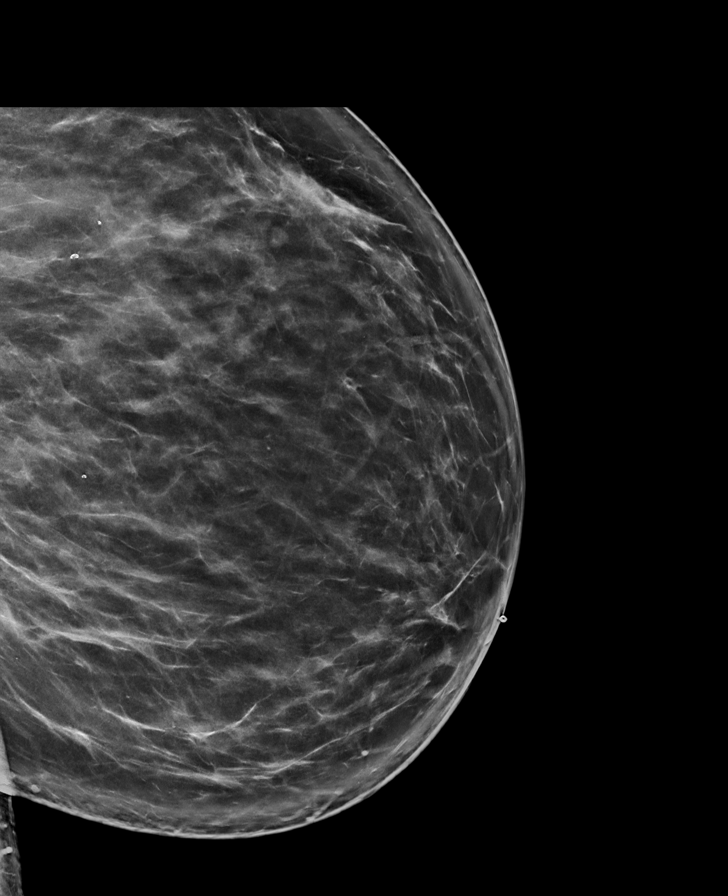

[L MLO synth-2D (2 of 2)]
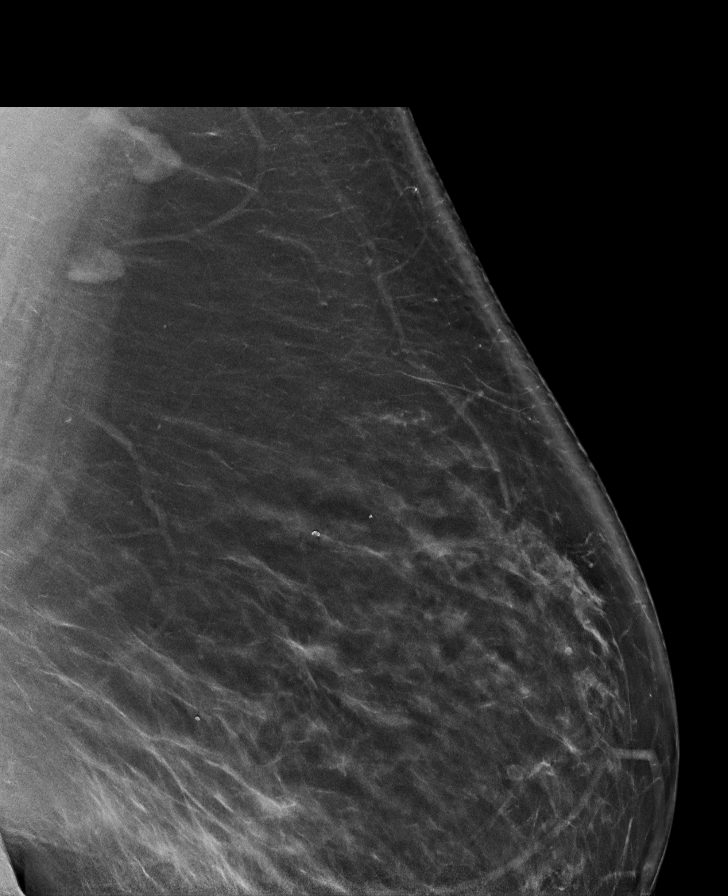

[R CC tomo · tomo slice 53/106.0]
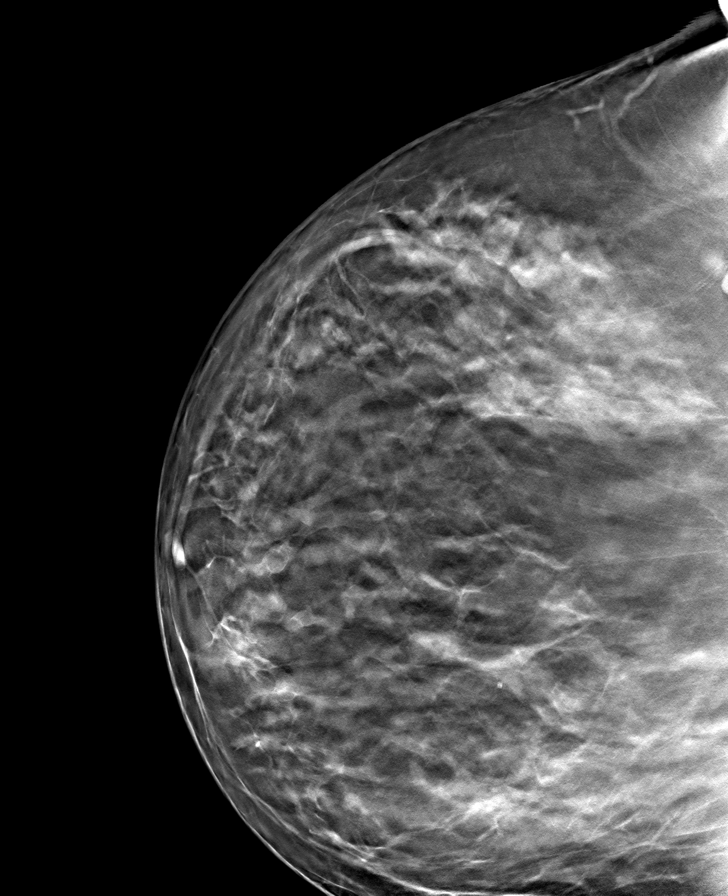

[8 of 37 positions shown; findings below may reference images not displayed]

ACR Breast Density Category c: The breast tissue is heterogeneously
dense, which may obscure small masses.
FINDINGS: There are no findings suspicious for malignancy. The images were
evaluated with computer-aided detection.
IMPRESSION: No mammographic evidence of malignancy. A result letter of this
screening mammogram will be mailed directly to the patient.

RECOMMENDATION:
Screening mammogram in one year. (Code:T4-5-GWO)

BI-RADS CATEGORY  1: Negative.

## 2023-05-08 ENCOUNTER — Encounter: Payer: Self-pay | Admitting: Internal Medicine

## 2023-05-10 ENCOUNTER — Encounter: Payer: Self-pay | Admitting: Internal Medicine

## 2023-06-09 ENCOUNTER — Ambulatory Visit
Admission: RE | Admit: 2023-06-09 | Discharge: 2023-06-09 | Disposition: A | Discharge status: Home / Self Care | Payer: BC Managed Care – PPO | Source: Ambulatory Visit | Attending: Internal Medicine | Admitting: Internal Medicine

## 2023-06-09 DIAGNOSIS — Z1231 Encounter for screening mammogram for malignant neoplasm of breast: Secondary | ICD-10-CM | POA: Diagnosis not present

## 2023-09-04 DIAGNOSIS — I10 Essential (primary) hypertension: Secondary | ICD-10-CM | POA: Diagnosis not present

## 2023-09-11 DIAGNOSIS — I10 Essential (primary) hypertension: Secondary | ICD-10-CM | POA: Diagnosis not present

## 2023-09-18 DIAGNOSIS — I10 Essential (primary) hypertension: Secondary | ICD-10-CM | POA: Diagnosis not present

## 2023-09-25 DIAGNOSIS — I10 Essential (primary) hypertension: Secondary | ICD-10-CM | POA: Diagnosis not present

## 2023-09-29 ENCOUNTER — Other Ambulatory Visit: Payer: Self-pay | Admitting: Pharmacist

## 2023-09-29 NOTE — Progress Notes (Signed)
Patient appearing on report for [employer] [facility] - Hypertension Control report due to last documented ambulatory blood pressure of 132/90 on 02/03/23. Next appointment with PCP is not scheduled   Outreached patient to discuss hypertension control and medication management.   Current antihypertensives: telmisartan/hydrochlorothiazide 160/12.5; prescribed amlodipine 2.5 mg daily but notes she had side effects, though does not remember what they were  Patient has an automated upper arm home BP machine.  Current blood pressure readings: has not checked recently.   Patient denies side effects related to telmisartan/hydrochlorothiazide     Assessment/Plan: - Currently uncontrolled - - Reviewed goal blood pressure <130/80 - Counseled on long term microvascular and macrovascular complications of uncontrolled hypertension - Reviewed appropriate home BP monitoring technique (avoid caffeine, smoking, and exercise for 30 minutes before checking, rest for at least 5 minutes before taking BP, sit with feet flat on the floor and back against a hard surface, uncross legs, and rest arm on flat surface) - Reviewed to check blood pressure periodically, document, and provide at next provider visit  Patient will send readings via MyChart. Declined to schedule follow up with PCP at this time.   Catie Eppie Gibson, PharmD, BCACP, CPP Clinical Pharmacist [facility] [phone]

## 2023-10-02 DIAGNOSIS — I10 Essential (primary) hypertension: Secondary | ICD-10-CM | POA: Diagnosis not present

## 2023-10-09 DIAGNOSIS — I10 Essential (primary) hypertension: Secondary | ICD-10-CM | POA: Diagnosis not present

## 2023-10-16 DIAGNOSIS — I10 Essential (primary) hypertension: Secondary | ICD-10-CM | POA: Diagnosis not present

## 2023-10-23 DIAGNOSIS — I10 Essential (primary) hypertension: Secondary | ICD-10-CM | POA: Diagnosis not present

## 2023-10-30 DIAGNOSIS — I10 Essential (primary) hypertension: Secondary | ICD-10-CM | POA: Diagnosis not present

## 2023-11-06 DIAGNOSIS — I10 Essential (primary) hypertension: Secondary | ICD-10-CM | POA: Diagnosis not present

## 2023-11-13 DIAGNOSIS — I10 Essential (primary) hypertension: Secondary | ICD-10-CM | POA: Diagnosis not present

## 2023-11-20 DIAGNOSIS — I10 Essential (primary) hypertension: Secondary | ICD-10-CM | POA: Diagnosis not present

## 2023-12-11 DIAGNOSIS — I10 Essential (primary) hypertension: Secondary | ICD-10-CM | POA: Diagnosis not present

## 2024-01-07 DIAGNOSIS — Z713 Dietary counseling and surveillance: Secondary | ICD-10-CM | POA: Diagnosis not present

## 2024-01-07 DIAGNOSIS — I10 Essential (primary) hypertension: Secondary | ICD-10-CM | POA: Diagnosis not present

## 2024-01-14 DIAGNOSIS — I10 Essential (primary) hypertension: Secondary | ICD-10-CM | POA: Diagnosis not present

## 2024-01-14 DIAGNOSIS — Z713 Dietary counseling and surveillance: Secondary | ICD-10-CM | POA: Diagnosis not present

## 2024-01-15 ENCOUNTER — Other Ambulatory Visit: Payer: Self-pay | Admitting: Internal Medicine

## 2024-01-15 ENCOUNTER — Other Ambulatory Visit (HOSPITAL_COMMUNITY): Payer: Self-pay

## 2024-01-15 DIAGNOSIS — I10 Essential (primary) hypertension: Secondary | ICD-10-CM

## 2024-01-21 DIAGNOSIS — Z6841 Body Mass Index (BMI) 40.0 and over, adult: Secondary | ICD-10-CM | POA: Diagnosis not present

## 2024-01-21 DIAGNOSIS — R0602 Shortness of breath: Secondary | ICD-10-CM | POA: Diagnosis not present

## 2024-01-21 DIAGNOSIS — R5383 Other fatigue: Secondary | ICD-10-CM | POA: Diagnosis not present

## 2024-01-21 DIAGNOSIS — E7800 Pure hypercholesterolemia, unspecified: Secondary | ICD-10-CM | POA: Diagnosis not present

## 2024-01-21 DIAGNOSIS — Z131 Encounter for screening for diabetes mellitus: Secondary | ICD-10-CM | POA: Diagnosis not present

## 2024-01-21 DIAGNOSIS — N914 Secondary oligomenorrhea: Secondary | ICD-10-CM | POA: Diagnosis not present

## 2024-01-21 DIAGNOSIS — D539 Nutritional anemia, unspecified: Secondary | ICD-10-CM | POA: Diagnosis not present

## 2024-01-21 DIAGNOSIS — E88819 Insulin resistance, unspecified: Secondary | ICD-10-CM | POA: Diagnosis not present

## 2024-01-21 DIAGNOSIS — E559 Vitamin D deficiency, unspecified: Secondary | ICD-10-CM | POA: Diagnosis not present

## 2024-01-21 DIAGNOSIS — Z79899 Other long term (current) drug therapy: Secondary | ICD-10-CM | POA: Diagnosis not present

## 2024-01-22 ENCOUNTER — Encounter: Payer: BC Managed Care – PPO | Admitting: Internal Medicine

## 2024-02-11 DIAGNOSIS — I10 Essential (primary) hypertension: Secondary | ICD-10-CM | POA: Diagnosis not present

## 2024-02-11 DIAGNOSIS — Z713 Dietary counseling and surveillance: Secondary | ICD-10-CM | POA: Diagnosis not present

## 2024-02-19 DIAGNOSIS — N914 Secondary oligomenorrhea: Secondary | ICD-10-CM | POA: Diagnosis not present

## 2024-02-19 DIAGNOSIS — E7800 Pure hypercholesterolemia, unspecified: Secondary | ICD-10-CM | POA: Diagnosis not present

## 2024-02-19 DIAGNOSIS — R7303 Prediabetes: Secondary | ICD-10-CM | POA: Diagnosis not present

## 2024-02-19 DIAGNOSIS — E88819 Insulin resistance, unspecified: Secondary | ICD-10-CM | POA: Diagnosis not present

## 2024-02-19 DIAGNOSIS — Z6841 Body Mass Index (BMI) 40.0 and over, adult: Secondary | ICD-10-CM | POA: Diagnosis not present

## 2024-02-25 DIAGNOSIS — Z713 Dietary counseling and surveillance: Secondary | ICD-10-CM | POA: Diagnosis not present

## 2024-02-25 DIAGNOSIS — I10 Essential (primary) hypertension: Secondary | ICD-10-CM | POA: Diagnosis not present

## 2024-03-10 DIAGNOSIS — Z713 Dietary counseling and surveillance: Secondary | ICD-10-CM | POA: Diagnosis not present

## 2024-03-10 DIAGNOSIS — I10 Essential (primary) hypertension: Secondary | ICD-10-CM | POA: Diagnosis not present

## 2024-03-18 DIAGNOSIS — E7800 Pure hypercholesterolemia, unspecified: Secondary | ICD-10-CM | POA: Diagnosis not present

## 2024-03-18 DIAGNOSIS — E88819 Insulin resistance, unspecified: Secondary | ICD-10-CM | POA: Diagnosis not present

## 2024-03-18 DIAGNOSIS — Z6841 Body Mass Index (BMI) 40.0 and over, adult: Secondary | ICD-10-CM | POA: Diagnosis not present

## 2024-03-18 DIAGNOSIS — R7303 Prediabetes: Secondary | ICD-10-CM | POA: Diagnosis not present

## 2024-03-18 DIAGNOSIS — N914 Secondary oligomenorrhea: Secondary | ICD-10-CM | POA: Diagnosis not present

## 2024-03-24 DIAGNOSIS — I10 Essential (primary) hypertension: Secondary | ICD-10-CM | POA: Diagnosis not present

## 2024-03-24 DIAGNOSIS — Z713 Dietary counseling and surveillance: Secondary | ICD-10-CM | POA: Diagnosis not present

## 2024-03-31 DIAGNOSIS — I10 Essential (primary) hypertension: Secondary | ICD-10-CM | POA: Diagnosis not present

## 2024-03-31 DIAGNOSIS — Z713 Dietary counseling and surveillance: Secondary | ICD-10-CM | POA: Diagnosis not present

## 2024-04-14 DIAGNOSIS — I10 Essential (primary) hypertension: Secondary | ICD-10-CM | POA: Diagnosis not present

## 2024-04-14 DIAGNOSIS — Z713 Dietary counseling and surveillance: Secondary | ICD-10-CM | POA: Diagnosis not present

## 2024-04-19 DIAGNOSIS — N914 Secondary oligomenorrhea: Secondary | ICD-10-CM | POA: Diagnosis not present

## 2024-04-19 DIAGNOSIS — Z6841 Body Mass Index (BMI) 40.0 and over, adult: Secondary | ICD-10-CM | POA: Diagnosis not present

## 2024-04-19 DIAGNOSIS — E7800 Pure hypercholesterolemia, unspecified: Secondary | ICD-10-CM | POA: Diagnosis not present

## 2024-04-19 DIAGNOSIS — R7303 Prediabetes: Secondary | ICD-10-CM | POA: Diagnosis not present

## 2024-04-19 DIAGNOSIS — E88819 Insulin resistance, unspecified: Secondary | ICD-10-CM | POA: Diagnosis not present

## 2024-04-29 DIAGNOSIS — Z713 Dietary counseling and surveillance: Secondary | ICD-10-CM | POA: Diagnosis not present

## 2024-04-29 DIAGNOSIS — I10 Essential (primary) hypertension: Secondary | ICD-10-CM | POA: Diagnosis not present

## 2024-05-13 DIAGNOSIS — I10 Essential (primary) hypertension: Secondary | ICD-10-CM | POA: Diagnosis not present

## 2024-05-13 DIAGNOSIS — Z713 Dietary counseling and surveillance: Secondary | ICD-10-CM | POA: Diagnosis not present

## 2024-06-03 DIAGNOSIS — I10 Essential (primary) hypertension: Secondary | ICD-10-CM | POA: Diagnosis not present

## 2024-06-03 DIAGNOSIS — Z713 Dietary counseling and surveillance: Secondary | ICD-10-CM | POA: Diagnosis not present

## 2024-06-21 ENCOUNTER — Other Ambulatory Visit: Payer: Self-pay | Admitting: Internal Medicine

## 2024-06-21 DIAGNOSIS — Z1231 Encounter for screening mammogram for malignant neoplasm of breast: Secondary | ICD-10-CM

## 2024-06-29 ENCOUNTER — Ambulatory Visit
Admission: RE | Admit: 2024-06-29 | Discharge: 2024-06-29 | Disposition: A | Discharge status: Home / Self Care | Source: Ambulatory Visit | Attending: Internal Medicine | Admitting: Internal Medicine

## 2024-06-29 DIAGNOSIS — Z1231 Encounter for screening mammogram for malignant neoplasm of breast: Secondary | ICD-10-CM | POA: Diagnosis not present

## 2024-12-26 ENCOUNTER — Other Ambulatory Visit: Payer: Self-pay | Admitting: Internal Medicine

## 2024-12-26 DIAGNOSIS — I10 Essential (primary) hypertension: Secondary | ICD-10-CM
